# Patient Record
Sex: Male | Born: 1950 | Hispanic: No | Marital: Single | Smoking: Former smoker
Health system: Southern US, Community
[De-identification: ages and names within clinical notes are randomized; demographics above are authoritative.]

## PROBLEM LIST (undated history)

## (undated) DIAGNOSIS — K219 Gastro-esophageal reflux disease without esophagitis: Secondary | ICD-10-CM

## (undated) HISTORY — DX: Gastro-esophageal reflux disease without esophagitis: K21.9

## (undated) HISTORY — PX: FINGER SURGERY: SHX640

---

## 2006-12-05 ENCOUNTER — Ambulatory Visit: Payer: Self-pay | Admitting: Internal Medicine

## 2006-12-13 ENCOUNTER — Ambulatory Visit: Payer: Self-pay | Admitting: Internal Medicine

## 2006-12-14 LAB — CONVERTED CEMR LAB
Glucose, Bld: 92 mg/dL (ref 70–99)
LDL Cholesterol: 71 mg/dL (ref 0–99)
VLDL: 17 mg/dL (ref 0–40)

## 2017-10-08 ENCOUNTER — Ambulatory Visit (INDEPENDENT_AMBULATORY_CARE_PROVIDER_SITE_OTHER)
Admission: RE | Admit: 2017-10-08 | Discharge: 2017-10-08 | Disposition: A | Discharge status: Home / Self Care | Payer: Medicare HMO | Source: Ambulatory Visit | Attending: Internal Medicine | Admitting: Internal Medicine

## 2017-10-08 ENCOUNTER — Encounter: Payer: Self-pay | Admitting: Internal Medicine

## 2017-10-08 ENCOUNTER — Ambulatory Visit (INDEPENDENT_AMBULATORY_CARE_PROVIDER_SITE_OTHER): Payer: Medicare HMO | Admitting: Internal Medicine

## 2017-10-08 VITALS — BP 128/70 | HR 87 | Temp 98.1°F | Ht 73.75 in | Wt 198.0 lb

## 2017-10-08 DIAGNOSIS — R079 Chest pain, unspecified: Secondary | ICD-10-CM

## 2017-10-08 DIAGNOSIS — Z23 Encounter for immunization: Secondary | ICD-10-CM | POA: Diagnosis not present

## 2017-10-08 DIAGNOSIS — K219 Gastro-esophageal reflux disease without esophagitis: Secondary | ICD-10-CM | POA: Diagnosis not present

## 2017-10-08 DIAGNOSIS — R0789 Other chest pain: Secondary | ICD-10-CM | POA: Insufficient documentation

## 2017-10-08 LAB — LIPID PANEL
CHOLESTEROL: 152 mg/dL (ref 0–200)
HDL: 58 mg/dL (ref >39.00)
LDL CALC: 79 mg/dL (ref 0–99)
NonHDL: 94.1
TRIGLYCERIDES: 76 mg/dL (ref 0.0–149.0)
Total CHOL/HDL Ratio: 3
VLDL: 15.2 mg/dL (ref 0.0–40.0)

## 2017-10-08 LAB — COMPREHENSIVE METABOLIC PANEL
ALT: 36 U/L (ref 0–53)
AST: 39 U/L — ABNORMAL HIGH (ref 0–37)
Albumin: 4.6 g/dL (ref 3.5–5.2)
Alkaline Phosphatase: 44 U/L (ref 39–117)
BUN: 22 mg/dL (ref 6–23)
CHLORIDE: 101 meq/L (ref 96–112)
CO2: 32 meq/L (ref 19–32)
Calcium: 10.1 mg/dL (ref 8.4–10.5)
Creatinine, Ser: 1.15 mg/dL (ref 0.40–1.50)
GFR: 67.8 mL/min (ref >60.00)
Glucose, Bld: 102 mg/dL — ABNORMAL HIGH (ref 70–99)
POTASSIUM: 4.2 meq/L (ref 3.5–5.1)
Sodium: 140 mEq/L (ref 135–145)
TOTAL PROTEIN: 7.6 g/dL (ref 6.0–8.3)
Total Bilirubin: 0.9 mg/dL (ref 0.2–1.2)

## 2017-10-08 LAB — CBC
HEMATOCRIT: 46.1 % (ref 39.0–52.0)
HEMOGLOBIN: 15.3 g/dL (ref 13.0–17.0)
MCHC: 33.1 g/dL (ref 30.0–36.0)
MCV: 98 fl (ref 78.0–100.0)
Platelets: 159 10*3/uL (ref 150.0–400.0)
RBC: 4.7 Mil/uL (ref 4.22–5.81)
RDW: 12.3 % (ref 11.5–15.5)
WBC: 6.6 10*3/uL (ref 4.0–10.5)

## 2017-10-08 LAB — T4, FREE: FREE T4: 0.72 ng/dL (ref 0.60–1.60)

## 2017-10-08 MED ORDER — OMEPRAZOLE 20 MG PO CPDR: 20.0000 mg | DELAYED_RELEASE_CAPSULE | Freq: Every day | ORAL | 3 refills | Status: DC

## 2017-10-08 NOTE — Assessment & Plan Note (Addendum)
Worrisome as related to fatigue and not feeling right But it is fairly constant over a week Doesn't seem to be musculoskeletal Could really be GERD related   EKG-- sinus at 87, normal axis and intervals Appears to be normal. No comparison CXR--no pneumonia or other findings. Increased interstitial markings. Will await overread  This is most likely acid related Could be heart though Will set up stress test Trial PPI to see if that helps

## 2017-10-08 NOTE — Progress Notes (Signed)
Subjective:    Patient ID: Travis RainbowJohn Q Bangerter Jr., male    DOB: 06/07/1951, 66 y.o.   MRN: [id number]  HPI Here due to chest pain and to reestablish care Hasn't been seen in 10 years but no other care  Has had a nagging pain across left chest--- may feel something with rubbing it Low energy and "felt crappy" Has been emotional also--very unusual Pain was worse earlier in the week--but other vague symptoms worsened  Did feel it into his left arm No SOB Pain was fairly constant--not clearly exertional Very active at work--lifting/walking, etc----- did note he was slower yesterday  No palpitations No dizziness  He called me yesterday ---I set up appt and advised 911 for recurrence Took ASA also then Still just has a mild tightness in upper left chest  Has been drinking more lately--relates to stress Mostly scotches-- at least 2 in evening to settle down  No current outpatient medications on file prior to visit.   No current facility-administered medications on file prior to visit.     No Known Allergies  Past Medical History:  Diagnosis Date  . GERD (gastroesophageal reflux disease)     Past Surgical History:  Procedure Laterality Date  . FINGER SURGERY     left 5th finger ---twice as child. chronic contracture    Family History  Problem Relation Age of Onset  . Breast cancer Mother   . Heart disease Father   . Breast cancer Sister   . Diabetes Neg Hx     Social History   Socioeconomic History  . Marital status: Divorced    Spouse name: Not on file  . Number of children: 2  . Years of education: Not on file  . Highest education level: Not on file  Social Needs  . Financial resource strain: Not on file  . Food insecurity - worry: Not on file  . Food insecurity - inability: Not on file  . Transportation needs - medical: Not on file  . Transportation needs - non-medical: Not on file  Occupational History  . Occupation: Holiday representativeConstruction    Comment: own  business  . Occupation: Interior and spatial designeracristan    Comment: [employer]  Tobacco Use  . Smoking status: Former Smoker    Last attempt to quit: 1978    Years since quitting: 40.9  . Smokeless tobacco: Never Used  Substance and Sexual Activity  . Alcohol use: Yes  . Drug use: Not on file  . Sexual activity: Not on file  Other Topics Concern  . Not on file  Social History Narrative   1 son and 1 daughter   Review of Systems  Constitutional: Negative for appetite change.       Weight has drifted up  HENT: Positive for hearing loss. Negative for tinnitus and trouble swallowing.   Eyes:       New bifocals--no vision loss  Respiratory: Positive for chest tightness. Negative for cough and shortness of breath.   Cardiovascular: Positive for chest pain. Negative for palpitations and leg swelling.  Gastrointestinal: Negative for blood in stool and constipation.       Rare heartburn/acid now Will have some water brash--better if he drinks water nexium in the past but none in about a year  Endocrine: Positive for polydipsia.  Genitourinary: Negative for difficulty urinating and urgency.  Musculoskeletal: Negative for arthralgias, back pain and joint swelling.  Skin: Negative for rash.       Needs derm evaluation--minor brown spots Some  trouble with psoriasis lately--uses topicals (on elbows, legs and scalp)  Allergic/Immunologic: Positive for environmental allergies. Negative for immunocompromised state.  Neurological: Negative for dizziness, syncope and headaches.  Hematological: Negative for adenopathy. Does not bruise/bleed easily.  Psychiatric/Behavioral: Negative for dysphoric mood and sleep disturbance.       Objective:   Physical Exam  Constitutional: He appears well-developed. No distress.  HENT:  Mouth/Throat: Oropharynx is clear and moist. No oropharyngeal exudate.  Neck: Normal range of motion. No thyromegaly present.  Cardiovascular: Normal rate, regular rhythm, normal  heart sounds and intact distal pulses. Exam reveals no gallop and no friction rub.  No murmur heard. Pulmonary/Chest: Effort normal and breath sounds normal. No respiratory distress. He has no wheezes. He has no rales. He exhibits no tenderness.  Abdominal: Soft. He exhibits no distension. There is no rebound and no guarding.  Chronic sensitivity in epigastric area--no change (but hard to assess due to this)  Musculoskeletal: He exhibits no edema or tenderness.  Lymphadenopathy:    He has no cervical adenopathy.  Skin:  Mild psoriatic changes  Psychiatric: He has a normal mood and affect. His behavior is normal.          Assessment & Plan:

## 2017-10-08 NOTE — Addendum Note (Signed)
Addended by: Eual FinesBRIDGES, SHANNON P on: 10/08/2017 04:56 PM   Modules accepted: Orders

## 2017-10-08 NOTE — Assessment & Plan Note (Signed)
I suspect his symptoms are acid related---like esophagitis Rx sent--asked him to take for at least 2 weeks

## 2017-10-09 ENCOUNTER — Other Ambulatory Visit: Payer: Self-pay | Admitting: Internal Medicine

## 2017-10-09 ENCOUNTER — Encounter: Payer: Self-pay | Admitting: Internal Medicine

## 2017-10-09 DIAGNOSIS — Z1211 Encounter for screening for malignant neoplasm of colon: Secondary | ICD-10-CM

## 2017-10-10 NOTE — Telephone Encounter (Signed)
This encounter was created in error - please disregard.

## 2017-10-15 ENCOUNTER — Telehealth: Payer: Self-pay

## 2017-10-15 NOTE — Telephone Encounter (Signed)
Pt scheduled for 12/4, 9am GXT. Reviewed instructions and directions to our office w/pt who verbalized understanding.

## 2017-10-16 ENCOUNTER — Ambulatory Visit (INDEPENDENT_AMBULATORY_CARE_PROVIDER_SITE_OTHER): Payer: Medicare HMO

## 2017-10-16 DIAGNOSIS — R079 Chest pain, unspecified: Secondary | ICD-10-CM

## 2017-10-19 LAB — EXERCISE TOLERANCE TEST
CHL CUP RESTING HR STRESS: 84 {beats}/min
CSEPEDS: 21 s
CSEPHR: 105 %
Estimated workload: 9 METS
Exercise duration (min): 7 min
MPHR: 155 {beats}/min
Peak HR: 164 {beats}/min

## 2018-02-05 DIAGNOSIS — Z87891 Personal history of nicotine dependence: Secondary | ICD-10-CM | POA: Diagnosis not present

## 2018-02-05 DIAGNOSIS — Z8249 Family history of ischemic heart disease and other diseases of the circulatory system: Secondary | ICD-10-CM | POA: Diagnosis not present

## 2018-02-05 DIAGNOSIS — Z803 Family history of malignant neoplasm of breast: Secondary | ICD-10-CM | POA: Diagnosis not present

## 2018-02-05 DIAGNOSIS — R030 Elevated blood-pressure reading, without diagnosis of hypertension: Secondary | ICD-10-CM | POA: Diagnosis not present

## 2018-02-28 ENCOUNTER — Telehealth: Payer: Self-pay | Admitting: Internal Medicine

## 2018-02-28 MED ORDER — AMOXICILLIN 500 MG PO TABS: 1000.0000 mg | ORAL_TABLET | Freq: Two times a day (BID) | ORAL | 0 refills | Status: AC

## 2018-02-28 NOTE — Telephone Encounter (Signed)
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

## 2018-10-14 ENCOUNTER — Encounter: Payer: Medicare HMO | Admitting: Internal Medicine

## 2018-10-14 DIAGNOSIS — Z0289 Encounter for other administrative examinations: Secondary | ICD-10-CM

## 2019-07-03 ENCOUNTER — Other Ambulatory Visit: Payer: Self-pay

## 2019-07-03 DIAGNOSIS — Z20822 Contact with and (suspected) exposure to covid-19: Secondary | ICD-10-CM

## 2019-07-04 LAB — NOVEL CORONAVIRUS, NAA: SARS-CoV-2, NAA: NOT DETECTED

## 2019-12-01 ENCOUNTER — Telehealth: Payer: Self-pay | Admitting: *Deleted

## 2019-12-01 ENCOUNTER — Encounter: Payer: Self-pay | Admitting: Internal Medicine

## 2019-12-01 ENCOUNTER — Ambulatory Visit (INDEPENDENT_AMBULATORY_CARE_PROVIDER_SITE_OTHER): Payer: HRSA Program | Admitting: Internal Medicine

## 2019-12-01 DIAGNOSIS — U071 COVID-19: Secondary | ICD-10-CM | POA: Diagnosis not present

## 2019-12-01 NOTE — Assessment & Plan Note (Addendum)
Exposed earlier but first symptoms on 1/13 Health department has extended his quarantine to 1/23--and I told him it needs to be when he has been feeling better for a few days Nothing to suggest pneumonia for now Discussed starting regular tylenol Fluids, calorie rich foot (having peanut butter crackers) Needs reevaluation if worsens

## 2019-12-01 NOTE — Telephone Encounter (Signed)
[facility] Night - Client TELEPHONE ADVICE RECORD Access[facility]urse Patient Name: Travis Pacheco Gender: Male DOB: 08/19/1950 Age: 69 Y 3 M 12 D Return Phone Number: [phone] (Primary) Address: City/State/Zip: [place] [address] Client [facility] Night - Client Client Site [facility] - Night Physician Tillman Abide - MD Contact Type Call Who Is Calling Patient / Member / Family / Caregiver Call Type Triage / Clinical Caller Name Caspian Deleonardis Ray Relationship To Patient Daughter Return Phone Number [phone] (Primary) Chief Complaint Back Pain - General Reason for Call Symptomatic / Request for Health Information Initial Comment Caller states she is calling regarding her father who currently has Covid and is in extreme pain, she states everywhere but mostly his back. Caller is trying to see if the Dr. can prescribe him something so he isn't round other people. Additional Comment Caller was not positive of her dad's year of birth. Translation No No Triage Reason Client directive prohibits Nurse Assessment Nurse: Gasper Sells, RN, Marylu Lund Date/Time Lamount Cohen Time): 12/01/2019 8:23:23 AM Confirm and document reason for call. If symptomatic, describe symptoms. ---Caller states she is calling regarding her father who currently has Covid and is in extreme pain, she states everywhere but mostly his back. Caller is trying to see if the Dr. can prescribe him something so he isn't round other people. Daughter is not with pt. States he is home and doubled over in pain/with girlfriend. Has the patient had close contact with a person known or suspected to have the novel coronavirus illness OR traveled / lives in area with major community spread (including international travel) in the last 14 days from the onset of symptoms? * If Asymptomatic, screen for exposure and travel within the last 14 days. ---Yes Does the  patient have any new or worsening symptoms? ---Yes Will a triage be completed? ---No Select reason for no triage. ---Client directive prohibits Please document clinical information provided and list any resource used. ---Unable to triage as pt is not with caller. Advised to connect 3 way with pt for permission and to ask pt questions. Advised office is closed. Pt has to be with the caller for triage. States he can't talk. Advised if pt is unable to speak, doubled over then he should go to ER for assistance. Caller hung up on nurse. PLEASE NOTE: All timestamps contained within this report are represented as [place] Standard Time. CONFIDENTIALTY NOTICE: This fax transmission is intended only for the addressee. It contains information that is legally privileged, confidential or otherwise protected from use or disclosure. If you are not the intended recipient, you are strictly prohibited from reviewing, disclosing, copying using or disseminating any of this information or taking any action in reliance on or regarding this information. If you have received this fax in error, please notify us immediately by telephone so that we can arrange for its return to [place]. Phone: [phone], Toll-Free: [phone], Fax: [phone] Page: 2 of 2 Call Id: [id number] Guidelines Guideline Title Affirmed Question Affirmed Notes Nurse Date/Time Lamount Cohen Time) Disp. Time Lamount Cohen Time) Disposition Final User 12/01/2019 8:03:37 AM Attempt made - message left Quentin Cornwall 12/01/2019 8:27:08 AM Clinical Call Yes Gasper Sells, RN, Marylu Lund

## 2019-12-01 NOTE — Telephone Encounter (Signed)
Patient called stating that he tested positive for covid on 11/25/19. Patient stated that he has had a fever. Patient stated that his temperature usually runs 3-4 degrees lower than normal and his temp has been running around 99.0. Patient stated that he has had sweats, chills, dry cough, vomited a little, body aches, fatigue and SOB. Patient stated that he has been able to drink and keep fluids down this morning. Advised patient to try to push fluids and rest. Patient was scheduled today for a virtual visit with Dr. Alphonsus Sias today at 2:45. ER precautions were given to patient and he verbalized understanding.

## 2019-12-01 NOTE — Telephone Encounter (Signed)
Pt already has virtual visit schedule with Dr Alphonsus Sias today at 2:45. [facility] LPN said when she talked with pt pt was not in extreme pain. FYI to Dr Darra Lis.

## 2019-12-01 NOTE — Progress Notes (Signed)
Subjective:    Patient ID: Travis Pacheco., male    DOB: [birth year], 69 y.o.   MRN: [id number]  HPI Video virtual visit due to positive COVID test and symptoms Identification done Reviewed billing and he gave consent Participants--he is at home and I am in my office  Exposed to COVID on New Year's Eve---someone with known exposure but not positive test Was told by health department to quarantine 1/5 Thinks he was exposed through his neighbor  Tested on 1/13--results positive the next day. This is the day of his first symptoms  Biggest issue is fatigue--"serious" Trying to force himself to drink liquids--but knows it isn't enough Various aches and pains No clear fever--but he is higher than his usual  No clear SOB Mild dry cough  Current Outpatient Medications on File Prior to Visit  Medication Sig Dispense Refill  . omeprazole (PRILOSEC) 20 MG capsule Take 1 capsule (20 mg total) by mouth daily. 30 capsule 3   No current facility-administered medications on file prior to visit.    No Known Allergies  Past Medical History:  Diagnosis Date  . GERD (gastroesophageal reflux disease)     Past Surgical History:  Procedure Laterality Date  . FINGER SURGERY     left 5th finger ---twice as child. chronic contracture    Family History  Problem Relation Age of Onset  . Breast cancer Mother   . Heart disease Father   . Breast cancer Sister   . Diabetes Neg Hx     Social History   Socioeconomic History  . Marital status: Divorced    Spouse name: Not on file  . Number of children: 2  . Years of education: Not on file  . Highest education level: Not on file  Occupational History  . Occupation: Holiday representative    Comment: own business  . 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 Smoking status: Former Smoker    Quit date: 1978    Years since quitting: 43.0  . Smokeless tobacco: Never Used  Substance and Sexual Activity  .  Alcohol use: Yes  . Drug use: Not on file  . Sexual activity: Not on file  Other Topics Concern  . Not on file  Social History Narrative   1 son and 1 daughter   Social Determinants of Health   Financial Resource Strain:   . Difficulty of Paying Living Expenses: Not on file  Food Insecurity:   . Worried About Programme researcher, broadcasting/film/video in the Last Year: Not on file  . Ran Out of Food in the Last Year: Not on file  Transportation Needs:   . Lack of Transportation (Medical): Not on file  . Lack of Transportation (Non-Medical): Not on file  Physical Activity:   . Days of Exercise per Week: Not on file  . Minutes of Exercise per Session: Not on file  Stress:   . Feeling of Stress : Not on file  Social Connections:   . Frequency of Communication with Friends and Family: Not on file  . Frequency of Social Gatherings with Friends and Family: Not on file  . Attends Religious Services: Not on file  . Active Member of Clubs or Organizations: Not on file  . Attends Banker Meetings: Not on file  . Marital Status: Not on file  Intimate Partner Violence:   . Fear of Current or Ex-Partner: Not on file  . Emotionally Abused: Not on file  .  Physically Abused: Not on file  . Sexually Abused: Not on file    Review of Systems Never can smell. Taste is off Some occasional nausea--only brief Slightly loose stools since yesterday--gas and some slight watery discharge No abdominal pain--but no appetite Has had sniffles since December--so staying away from work    Objective:   Physical Exam  Constitutional: No distress.  Looks washed out but strong voice and alert  Respiratory: Effort normal. No respiratory distress.  Psychiatric: He has a normal mood and affect. His behavior is normal.           Assessment & Plan:

## 2019-12-01 NOTE — Telephone Encounter (Signed)
Okay Will evaluate at virtual visit and see if any supportive measures might help (like anti-emetics)

## 2019-12-01 NOTE — Telephone Encounter (Signed)
Noted  Will review all at visit and consider analgesic alternatives

## 2019-12-03 ENCOUNTER — Telehealth: Payer: Self-pay | Admitting: Internal Medicine

## 2019-12-03 MED ORDER — ONDANSETRON HCL 4 MG PO TABS: 4.0000 mg | ORAL_TABLET | Freq: Three times a day (TID) | ORAL | 0 refills | Status: DC | PRN

## 2019-12-03 NOTE — Telephone Encounter (Signed)
Contacted me No respiratory symptoms but is having diarrhea and nausea Will send Rx for ondansetron for the nausea---in hopes this will allow him to maintain oral intake and prevent dehydration

## 2020-01-05 ENCOUNTER — Ambulatory Visit: Payer: Self-pay | Attending: Internal Medicine

## 2020-01-05 DIAGNOSIS — Z23 Encounter for immunization: Secondary | ICD-10-CM | POA: Insufficient documentation

## 2020-01-05 NOTE — Progress Notes (Signed)
   [id number]  Name:  Travis Pacheco.    MRN: [id number] DOB: [birth year]  01/05/2020  Travis Pacheco was observed post Covid-19 immunization for 15 minutes without incidence. He was provided with Vaccine Information Sheet and instruction to access the V-Safe system.   Travis Pacheco was instructed to call 911 with any severe reactions post vaccine: Marland Kitchen Difficulty breathing  . Swelling of your face and throat  . A fast heartbeat  . A bad rash all over your body  . Dizziness and weakness    Immunizations Administered    Name Date Dose VIS Date Route   Moderna COVID-19 Vaccine 01/05/2020 11:09 AM 0.5 mL 10/14/2019 Intramuscular   Manufacturer: Moderna   Lot: [id number]   NDC: 44461-901-22

## 2020-02-03 ENCOUNTER — Ambulatory Visit: Payer: Self-pay | Attending: Internal Medicine

## 2020-02-03 DIAGNOSIS — Z23 Encounter for immunization: Secondary | ICD-10-CM

## 2020-02-03 NOTE — Progress Notes (Signed)
   [id number]  Name:  Travis Pacheco.    MRN: [id number] DOB: Jun 28, 1951  02/03/2020  [name] was observed post Covid-19 immunization for 15 minutes without incident. He was provided with Vaccine Information Sheet and instruction to access the V-Safe system.   [name] was instructed to call 911 with any severe reactions post vaccine: Marland Kitchen Difficulty breathing  . Swelling of face and throat  . A fast heartbeat  . A bad rash all over body  . Dizziness and weakness   Immunizations Administered    Name Date Dose VIS Date Route   Moderna COVID-19 Vaccine 02/03/2020  9:58 AM 0.5 mL 10/14/2019 Intramuscular   Manufacturer: Gala Murdoch   Lot: [id number]   NDC: 71994-129-04

## 2020-09-06 ENCOUNTER — Ambulatory Visit: Payer: Self-pay | Admitting: Internal Medicine

## 2020-09-06 ENCOUNTER — Encounter: Payer: Self-pay | Admitting: Internal Medicine

## 2020-09-06 ENCOUNTER — Telehealth: Payer: Self-pay | Admitting: Internal Medicine

## 2020-09-06 ENCOUNTER — Other Ambulatory Visit: Payer: Self-pay

## 2020-09-06 VITALS — BP 110/70 | HR 116 | Temp 97.8°F | Ht 76.0 in | Wt 197.0 lb

## 2020-09-06 DIAGNOSIS — Z23 Encounter for immunization: Secondary | ICD-10-CM

## 2020-09-06 DIAGNOSIS — R319 Hematuria, unspecified: Secondary | ICD-10-CM

## 2020-09-06 DIAGNOSIS — N390 Urinary tract infection, site not specified: Secondary | ICD-10-CM | POA: Insufficient documentation

## 2020-09-06 DIAGNOSIS — R300 Dysuria: Secondary | ICD-10-CM

## 2020-09-06 LAB — POC URINALSYSI DIPSTICK (AUTOMATED)
Bilirubin, UA: NEGATIVE
Glucose, UA: NEGATIVE
Nitrite, UA: NEGATIVE
Protein, UA: POSITIVE — AB
Spec Grav, UA: 1.015 (ref 1.010–1.025)
Urobilinogen, UA: 1 E.U./dL
pH, UA: 6.5 (ref 5.0–8.0)

## 2020-09-06 MED ORDER — SULFAMETHOXAZOLE-TRIMETHOPRIM 800-160 MG PO TABS: 1.0000 | ORAL_TABLET | Freq: Two times a day (BID) | ORAL | 0 refills | Status: DC

## 2020-09-06 MED ORDER — CEFTRIAXONE SODIUM 1 G IJ SOLR
1.0000 g | Freq: Once | INTRAMUSCULAR | Status: AC
  Administered 2020-09-06: 1 g via INTRAMUSCULAR

## 2020-09-06 NOTE — Assessment & Plan Note (Signed)
Symptoms suggestive of kidney involvement---systemic symptoms, CVA tenderness No history of stones but that could explain it (especially if obstruction) No clear indication of urinary obstruction--but that is possible Will Rx with rocephin now---then septra Check CT scan Urology evaluation

## 2020-09-06 NOTE — Addendum Note (Signed)
Addended by: Eual Fines on: 09/06/2020 04:15 PM   Modules accepted: Orders

## 2020-09-06 NOTE — Progress Notes (Signed)
Subjective:    Patient ID: Roseanna Rainbow., male    DOB: 12-16-1950, 69 y.o.   MRN: [id number]  HPI Here due to urinary symptoms This visit occurred during the SARS-CoV-2 public health emergency.  Safety protocols were in place, including screening questions prior to the visit, additional usage of staff PPE, and extensive cleaning of exam room while observing appropriate contact time as indicated for disinfecting solutions.   Started 2 days ago--but very mild Mild dysuria that night Yesterday AM---noted significant urgency and dysuria Then started having flecks of blood  Has felt somewhat bad No fever No sweats---?slight chill  Current Outpatient Medications on File Prior to Visit  Medication Sig Dispense Refill   omeprazole (PRILOSEC) 20 MG capsule Take 1 capsule (20 mg total) by mouth daily. 30 capsule 3   No current facility-administered medications on file prior to visit.    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  Tobacco Use   Smoking status: Former Smoker    Quit date: 1978    Years since quitting: 43.8   Smokeless tobacco: Never Used  Substance and Sexual Activity   Alcohol use: Yes   Drug use: Not on file   Sexual activity: Not on file  Other Topics Concern   Not on file   Social History Narrative   1 son and 1 daughter   Social Determinants of Health   Financial Resource Strain:    Difficulty of Paying Living Expenses: Not on file  Food Insecurity:    Worried About Programme researcher, broadcasting/film/video in the Last Year: Not on file   [employer] of Food in the Last Year: Not on file  Transportation Needs:    Lack of Transportation (Medical): Not on file   Lack of Transportation (Non-Medical): Not on file  Physical Activity:    Days of Exercise per Week: Not on file   Minutes of Exercise per Session: Not on file  Stress:    Feeling of Stress : Not on file  Social Connections:    Frequency of Communication with Friends and Family: Not on file   Frequency of Social Gatherings with Friends and Family: Not on file   Attends Religious Services: Not on file   Active Member of Clubs or Organizations: Not on file   Attends Banker Meetings: Not on file   Marital Status: Not on file  Intimate Partner Violence:    Fear of Current or Ex-Partner: Not on file   Emotionally Abused: Not on file   Physically Abused: Not on file   Sexually Abused: Not on file   Review of Systems Didn't sleep well last night Some vomiting---dry heaves (this  morning) Hasn't eaten today No history of kidney stones Has had 2 episodes of prostatitis----symptoms were not like this    Objective:   Physical Exam Constitutional:      Appearance: Normal appearance.  Cardiovascular:     Rate and Rhythm: Normal rate and regular rhythm.     Heart sounds: No murmur heard.  No gallop.   Pulmonary:     Effort: Pulmonary effort is normal.     Breath sounds: Normal breath sounds. No wheezing or rales.  Abdominal:     Palpations: Abdomen is soft.     Tenderness: There is no abdominal tenderness.     Comments: Mild left CVA tenderness  Musculoskeletal:     Cervical back: Neck supple.     Right lower leg: No edema.     Left lower leg: No edema.  Lymphadenopathy:      Cervical: No cervical adenopathy.  Neurological:     Mental Status: He is alert.            Assessment & Plan:

## 2020-09-06 NOTE — Telephone Encounter (Signed)
Called to schedule appt today at 1:45 per request from Dr. Alphonsus Sias. Phone went straight to voicemail. I left vm asking him to call office to schedule.

## 2020-09-06 NOTE — Addendum Note (Signed)
Addended by: Eual Fines on: 09/06/2020 04:20 PM   Modules accepted: Orders

## 2020-09-06 NOTE — Addendum Note (Signed)
Addended by: Eual Fines on: 09/06/2020 03:03 PM   Modules accepted: Orders

## 2020-09-07 LAB — CBC
HCT: 46.3 % (ref 39.0–52.0)
Hemoglobin: 15.9 g/dL (ref 13.0–17.0)
MCHC: 34.3 g/dL (ref 30.0–36.0)
MCV: 99.3 fl (ref 78.0–100.0)
Platelets: 138 10*3/uL — ABNORMAL LOW (ref 150.0–400.0)
RBC: 4.66 Mil/uL (ref 4.22–5.81)
RDW: 12.1 % (ref 11.5–15.5)
WBC: 12.5 10*3/uL — ABNORMAL HIGH (ref 4.0–10.5)

## 2020-09-07 LAB — RENAL FUNCTION PANEL
Albumin: 4.4 g/dL (ref 3.5–5.2)
BUN: 14 mg/dL (ref 6–23)
CO2: 29 mEq/L (ref 19–32)
Calcium: 9.8 mg/dL (ref 8.4–10.5)
Chloride: 98 mEq/L (ref 96–112)
Creatinine, Ser: 1.08 mg/dL (ref 0.40–1.50)
GFR: 70.74 mL/min (ref >60.00)
Glucose, Bld: 111 mg/dL — ABNORMAL HIGH (ref 70–99)
Phosphorus: 2.5 mg/dL (ref 2.3–4.6)
Potassium: 4.1 mEq/L (ref 3.5–5.1)
Sodium: 136 mEq/L (ref 135–145)

## 2020-09-07 LAB — HEPATIC FUNCTION PANEL
ALT: 16 U/L (ref 0–53)
AST: 22 U/L (ref 0–37)
Albumin: 4.4 g/dL (ref 3.5–5.2)
Alkaline Phosphatase: 53 U/L (ref 39–117)
Bilirubin, Direct: 0.4 mg/dL — ABNORMAL HIGH (ref 0.0–0.3)
Total Bilirubin: 2.2 mg/dL — ABNORMAL HIGH (ref 0.2–1.2)
Total Protein: 7.4 g/dL (ref 6.0–8.3)

## 2020-09-08 LAB — URINE CULTURE
MICRO NUMBER:: 11113791
SPECIMEN QUALITY:: ADEQUATE

## 2020-09-14 ENCOUNTER — Other Ambulatory Visit: Payer: Self-pay

## 2020-09-14 ENCOUNTER — Ambulatory Visit (INDEPENDENT_AMBULATORY_CARE_PROVIDER_SITE_OTHER): Payer: Self-pay | Admitting: Urology

## 2020-09-14 ENCOUNTER — Encounter: Payer: Self-pay | Admitting: Urology

## 2020-09-14 VITALS — BP 123/71 | HR 79 | Ht 76.0 in | Wt 192.0 lb

## 2020-09-14 DIAGNOSIS — R310 Gross hematuria: Secondary | ICD-10-CM

## 2020-09-14 DIAGNOSIS — N419 Inflammatory disease of prostate, unspecified: Secondary | ICD-10-CM

## 2020-09-14 MED ORDER — SULFAMETHOXAZOLE-TRIMETHOPRIM 800-160 MG PO TABS: 1.0000 | ORAL_TABLET | Freq: Two times a day (BID) | ORAL | 0 refills | Status: DC

## 2020-09-14 NOTE — Progress Notes (Signed)
   09/14/2020 2:58 PM   Travis Pacheco. 08/02/1951 [id number]  Referring provider: [name], MD [address] [place],  [place] [address]  Chief Complaint  Patient presents with  . Hematuria    HPI: Travis Pacheco. is a 69 y.o. male seen at the request of Dr. Alphonsus Sias for evaluation of UTI/gross hematuria.   Seen 09/06/2020 by Dr. Alphonsus Sias with a 2-day history of mild dysuria which progressively worsened and associated with mild gross hematuria  Associated malaise   Dipstick urinalysis with large leukocytes  Was treated with 1 g Rocephin and discharged with Rx Septra DS  Urine subsequently grew E. coli sensitive to ceftriaxone and Septra  States symptoms resolved within 8 hours of the injection  Presently asymptomatic  Denies recurrent hematuria  Prior history of prostatitis  Scheduled for CT abdomen pelvis with contrast 09/21/2020   PMH: Past Medical History:  Diagnosis Date  . GERD (gastroesophageal reflux disease)     Surgical History: Past Surgical History:  Procedure Laterality Date  . FINGER SURGERY     left 5th finger ---twice as child. chronic contracture    Home Medications:  Allergies as of 09/14/2020   No Known Allergies     Medication List       Accurate as of September 14, 2020  2:58 PM. If you have any questions, ask your nurse or doctor.        omeprazole 20 MG capsule Commonly known as: PRILOSEC Take 1 capsule (20 mg total) by mouth daily.   sulfamethoxazole-trimethoprim 800-160 MG tablet Commonly known as: BACTRIM DS Take 1 tablet by mouth 2 (two) times daily.       Allergies: No Known Allergies  Family History: Family History  Problem Relation Age of Onset  . Breast cancer Mother   . Heart disease Father   . Breast cancer Sister   . Diabetes Neg Hx     Social History:  reports that he quit smoking about 43 years ago. He has never used smokeless tobacco. He reports current alcohol use. No history on  file for drug use.   Physical Exam: BP 123/71   Pulse 79   Ht 6\' 4"  (1.93 m)   Wt 192 lb (87.1 kg)   BMI 23.37 kg/m   Constitutional:  Alert and oriented, No acute distress. HEENT: [place] AT, moist mucus membranes.  Trachea midline, no masses. Cardiovascular: No clubbing, cyanosis, or edema. Respiratory: Normal respiratory effort, no increased work of breathing. GI: Abdomen is soft, nontender, nondistended, no abdominal masses GU: No CVA tenderness, prostate 35 g, smooth without nodules Skin: No rashes, bruises or suspicious lesions. Neurologic: Grossly intact, no focal deficits, moving all 4 extremities. Psychiatric: Normal mood and affect.  Laboratory Data:  Urinalysis Dipstick/microscopy negative  Assessment & Plan:    1.  Urinary tract infection  We did discuss in men the most common source is the prostate and he had mild systemic symptoms  Would recommend treating with a 3-week course of antibiotics and an additional 10 days of Septra DS was sent to pharmacy  2.  Gross hematuria  Most likely secondary to above  He is scheduled for CT abdomen with contrast and will change to a CT urogram which is the recommended diagnostic imaging for hematuria   , MD  [facility] [address] [place], [place] [phone]

## 2020-09-15 ENCOUNTER — Telehealth: Payer: Self-pay | Admitting: Internal Medicine

## 2020-09-15 NOTE — Telephone Encounter (Signed)
Dr.Stoioff's office called to into PCP that they did see patient and canceled our original order of the CT.In their office it needed to be a CT Hematuria workup but did state for same thing. Sending as Lorain Childes.

## 2020-09-16 LAB — MICROSCOPIC EXAMINATION: Bacteria, UA: NONE SEEN

## 2020-09-16 LAB — URINALYSIS, COMPLETE
Bilirubin, UA: NEGATIVE
Glucose, UA: NEGATIVE
Ketones, UA: NEGATIVE
Leukocytes,UA: NEGATIVE
Nitrite, UA: NEGATIVE
Protein,UA: NEGATIVE
RBC, UA: NEGATIVE
Specific Gravity, UA: 1.025 (ref 1.005–1.030)
Urobilinogen, Ur: 0.2 mg/dL (ref 0.2–1.0)
pH, UA: 5.5 (ref 5.0–7.5)

## 2020-09-16 NOTE — Telephone Encounter (Signed)
Yes---I saw his note and am in agreement with the best test to evaluate the circumstances

## 2020-09-21 ENCOUNTER — Ambulatory Visit
Admission: RE | Admit: 2020-09-21 | Discharge: 2020-09-21 | Disposition: A | Discharge status: Home / Self Care | Payer: Self-pay | Source: Ambulatory Visit | Attending: Urology | Admitting: Urology

## 2020-09-21 ENCOUNTER — Ambulatory Visit: Payer: Self-pay

## 2020-09-21 ENCOUNTER — Other Ambulatory Visit: Payer: Self-pay

## 2020-09-21 DIAGNOSIS — R310 Gross hematuria: Secondary | ICD-10-CM | POA: Insufficient documentation

## 2020-09-21 MED ORDER — IOHEXOL 300 MG/ML  SOLN
125.0000 mL | Freq: Once | INTRAMUSCULAR | Status: AC | PRN
  Administered 2020-09-21: 125 mL via INTRAVENOUS

## 2020-09-24 ENCOUNTER — Telehealth: Payer: Self-pay | Admitting: Family Medicine

## 2020-09-24 NOTE — Telephone Encounter (Signed)
-----   Message from [name], MD sent at 09/24/2020  2:12 PM EST ----- CT urogram showed no significant abnormalities.  Recommend 1 month follow-up for repeat UA and symptom check

## 2020-09-24 NOTE — Telephone Encounter (Signed)
Patient notified and appointment has been made.  

## 2020-10-11 ENCOUNTER — Ambulatory Visit: Payer: Self-pay | Attending: Internal Medicine

## 2020-10-11 DIAGNOSIS — Z23 Encounter for immunization: Secondary | ICD-10-CM

## 2020-10-11 NOTE — Progress Notes (Signed)
   [id number]  Name:  Travis Pacheco.    MRN: [id number] DOB: 1951-10-09  10/11/2020  Mr. [name] was observed post Covid-19 immunization for 15 minutes without incident. He was provided with Vaccine Information Sheet and instruction to access the V-Safe system.   Mr. [name] was instructed to call 911 with any severe reactions post vaccine: Marland Kitchen Difficulty breathing  . Swelling of face and throat  . A fast heartbeat  . A bad rash all over body  . Dizziness and weakness   Immunizations Administered    Name Date Dose VIS Date Route   Pfizer COVID-19 Vaccine 10/11/2020  1:49 PM 0.3 mL 09/01/2020 Intramuscular   Manufacturer: [employer], [employer]   Lot: [id number]   NDC: [address]-7

## 2020-10-28 ENCOUNTER — Ambulatory Visit: Payer: Self-pay | Admitting: Urology

## 2020-10-29 ENCOUNTER — Encounter: Payer: Self-pay | Admitting: Urology

## 2020-10-29 ENCOUNTER — Ambulatory Visit (INDEPENDENT_AMBULATORY_CARE_PROVIDER_SITE_OTHER): Payer: Self-pay | Admitting: Urology

## 2020-10-29 ENCOUNTER — Other Ambulatory Visit: Payer: Self-pay

## 2020-10-29 VITALS — BP 150/85 | HR 80 | Ht 76.0 in | Wt 192.0 lb

## 2020-10-29 DIAGNOSIS — R310 Gross hematuria: Secondary | ICD-10-CM

## 2020-10-29 LAB — URINALYSIS, COMPLETE
Bilirubin, UA: NEGATIVE
Glucose, UA: NEGATIVE
Ketones, UA: NEGATIVE
Leukocytes,UA: NEGATIVE
Nitrite, UA: NEGATIVE
Protein,UA: NEGATIVE
RBC, UA: NEGATIVE
Specific Gravity, UA: 1.025 (ref 1.005–1.030)
Urobilinogen, Ur: 1 mg/dL (ref 0.2–1.0)
pH, UA: 5.5 (ref 5.0–7.5)

## 2020-10-29 NOTE — Progress Notes (Signed)
10/29/2020 8:38 AM   Travis Pacheco. 28-Nov-1950 [id number]  Referring provider: [name], MD [address] [place],  [place] [address]  Chief Complaint  Patient presents with  . Hematuria    HPI: 69 y.o. male presents for 1 month follow-up   Initially seen 09/14/2020 with UTI suspicious for acute prostatitis that was associated with gross hematuria  CT urogram performed 09/21/2020 showed no upper tract abnormalities and mild bladder trabeculation  He has completed antibiotics and symptoms and hematuria resolved  No complaints today   PMH: Past Medical History:  Diagnosis Date  . GERD (gastroesophageal reflux disease)     Surgical History: Past Surgical History:  Procedure Laterality Date  . FINGER SURGERY     left 5th finger ---twice as child. chronic contracture    Home Medications:  Allergies as of 10/29/2020   No Known Allergies     Medication List       Accurate as of October 29, 2020  8:38 AM. If you have any questions, ask your nurse or doctor.        STOP taking these medications   omeprazole 20 MG capsule Commonly known as: PRILOSEC Stopped by: [name], MD   sulfamethoxazole-trimethoprim 800-160 MG tablet Commonly known as: BACTRIM DS Stopped by: [name], MD       Allergies: No Known Allergies  Family History: Family History  Problem Relation Age of Onset  . Breast cancer Mother   . Heart disease Father   . Breast cancer Sister   . Diabetes Neg Hx     Social History:  reports that he quit smoking about 43 years ago. He has never used smokeless tobacco. He reports current alcohol use. No history on file for drug use.   Physical Exam: BP (!) 150/85   Pulse 80   Ht 6\' 4"  (1.93 m)   Wt 192 lb (87.1 kg)   BMI 23.37 kg/m   Constitutional:  Alert and oriented, No acute distress.  Laboratory Data:  Urinalysis Dipstick/microscopy negative   Pertinent Imaging: CT images personally reviewed  and interpreted  CT HEMATURIA WORKUP  Narrative CLINICAL DATA:  Gross hematuria.  EXAM: CT ABDOMEN AND PELVIS WITHOUT AND WITH CONTRAST  TECHNIQUE: Multidetector CT imaging of the abdomen and pelvis was performed following the standard protocol before and following the bolus administration of intravenous contrast.  CONTRAST:  OMNIPAQUE IOHEXOL 300 MG/ML  SOLN  COMPARISON:  None.  FINDINGS: Lower chest: Unremarkable.  Hepatobiliary: No suspicious focal abnormality within the liver parenchyma. There is no evidence for gallstones, gallbladder wall thickening, or pericholecystic fluid. No intrahepatic or extrahepatic biliary dilation.  Pancreas: No focal mass lesion. No dilatation of the main duct. No intraparenchymal cyst. No peripancreatic edema.  Spleen: No splenomegaly. No focal mass lesion.  Adrenals/Urinary Tract: No adrenal nodule or mass.  Precontrast imaging shows no stones in either kidney or ureter no bladder stones.  Imaging after IV contrast administration shows no suspicious enhancing lesion in either kidney.  Delayed post-contrast imaging shows no wall thickening or soft tissue filling defect in either intrarenal collecting system or renal pelvis. Neither ureter is completely opacified, but there is no focal hydroureter, evidence for ureteral wall thickening, or ureteral mass lesion by CT imaging. Subtle bladder wall trabeculation noted with tiny posterolateral bladder diverticuli seen bilaterally. By CT imaging. No focal bladder wall abnormality  Stomach/Bowel: Stomach is unremarkable. No gastric wall thickening. No evidence of outlet obstruction. Duodenum is  normally positioned as is the ligament of Treitz. No small bowel wall thickening. No small bowel dilatation. The terminal ileum is normal. The appendix is not visualized, but there is no edema or inflammation in the region of the cecum. No gross colonic mass. No colonic  wall thickening.  Vascular/Lymphatic: There is abdominal aortic atherosclerosis without aneurysm. There is no gastrohepatic or hepatoduodenal ligament lymphadenopathy. No retroperitoneal or mesenteric lymphadenopathy. No pelvic sidewall lymphadenopathy.  Reproductive: The prostate gland and seminal vesicles are unremarkable.  Other: No intraperitoneal free fluid.  Musculoskeletal: No worrisome lytic or sclerotic osseous abnormality.  IMPRESSION: 1. No acute findings in the abdomen or pelvis. Specifically, no findings to explain the patient's history of hematuria. 2. Subtle bladder wall trabeculation with tiny posterolateral bladder diverticuli seen bilaterally. Component of underlying bladder outlet obstruction not excluded. 3. Aortic Atherosclerosis (ICD10-I70.0).   Electronically Signed By: Kennith Center M.D. On: 09/22/2020 10:31  No results found for this or any previous visit.   Assessment & Plan:    1. Gross hematuria  Resolved and seen in conjunction with symptomatic urinary tract infection  Hematuria resolved after antibiotic treatment  Urinalysis today is clear  Cystoscopy for recurrent gross or microscopic hematuria  Follow-up 6 months for repeat UA  2. Acute prostatitis  Resolved   [name], MD  [facility] [address] [place], [place] [address] [phone]

## 2021-02-14 IMAGING — CT CT ABD-PEL WO/W CM
3 of 12 series · 11 of 46 positions shown, 17 images · IV contrast (omnipaque)
Comparison: None.

CLINICAL DATA: Gross hematuria.

EXAM:
CT ABDOMEN AND PELVIS WITHOUT AND WITH CONTRAST
TECHNIQUE: Multidetector CT imaging of the abdomen and pelvis was performed
following the standard protocol before and following the bolus
administration of intravenous contrast.
CONTRAST:  125mL OMNIPAQUE IOHEXOL 300 MG/ML  SOLN

[Series 2: abd without pre 5.00 · axial · non-contrast · 0.77mm/px · z∈[-1559,-1444]mm · 3 of 92 slices shown]
[im 12/92  soft-tissue]
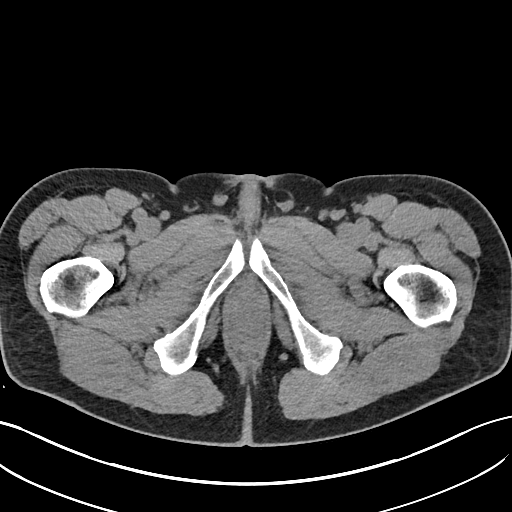
[im 23/92  soft-tissue]
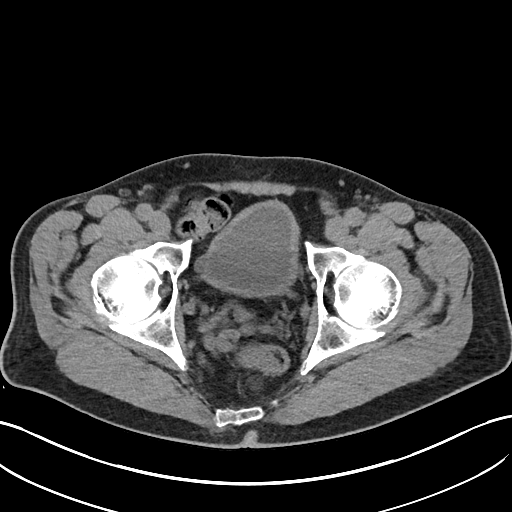
[im 35/92  soft-tissue]
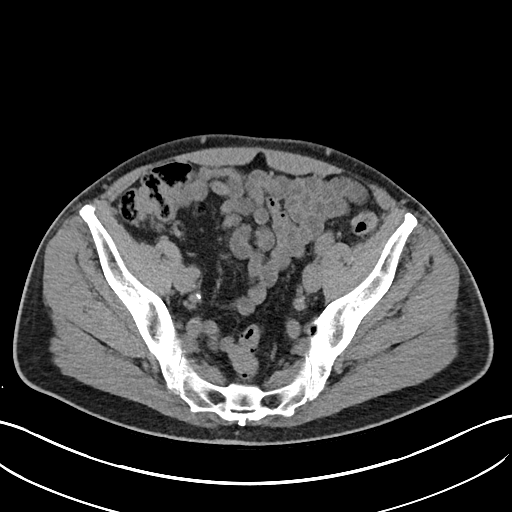

[Series 5: cor without without pre 2.00 cor · coronal · non-contrast · 0.77mm/px · 2 of 147 slices shown, 3 images]
[im 49/147  soft-tissue]
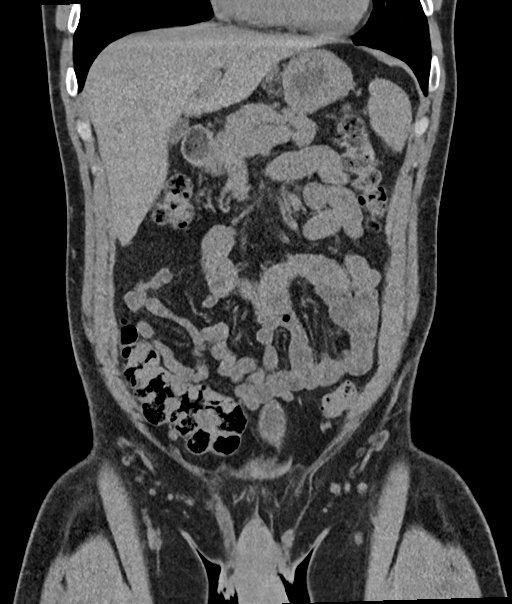
[im 49/147  bone]
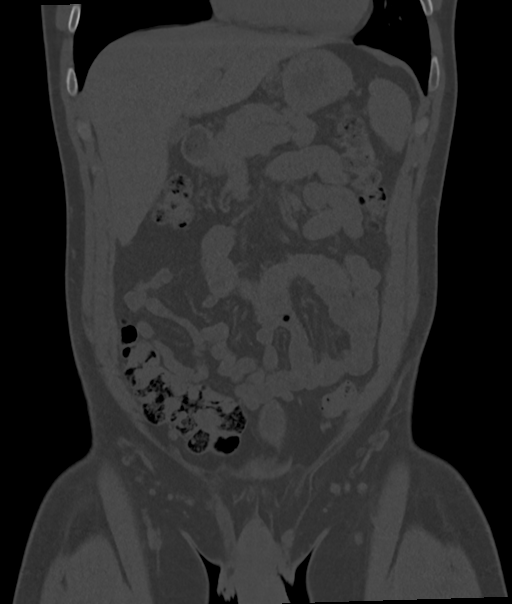
[im 98/147  soft-tissue]
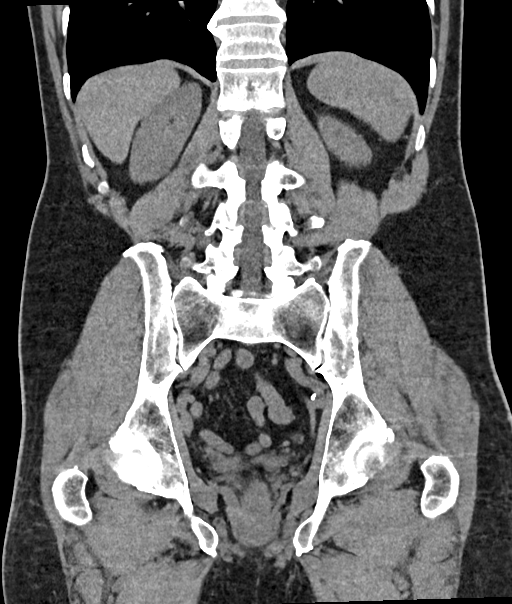

[Series 17: axial delay delay prone 5.00 · axial · delayed · 0.79mm/px · z∈[-1543,-1208]mm · 6 of 95 slices shown, 11 images]
[im 14/95  soft-tissue]
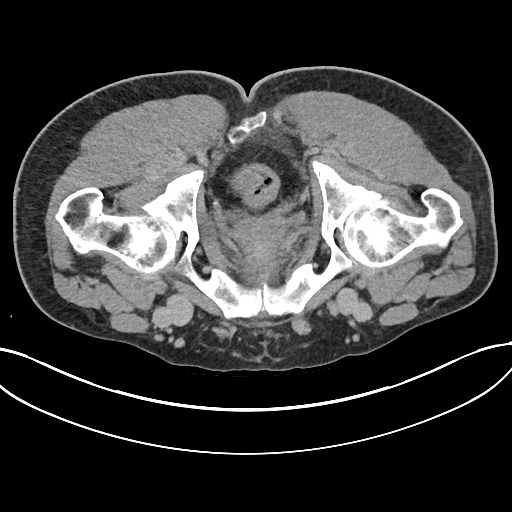
[im 14/95  bone]
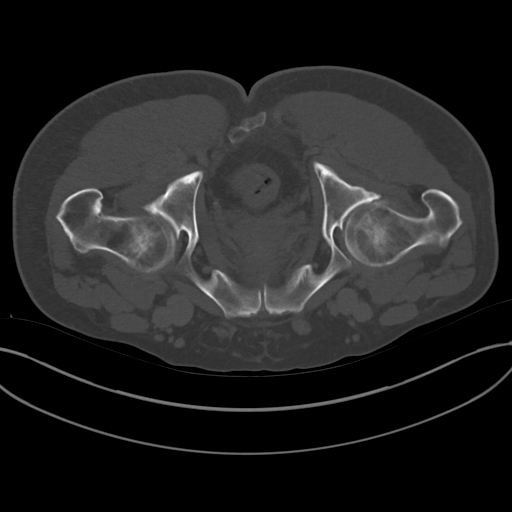
[im 27/95  soft-tissue]
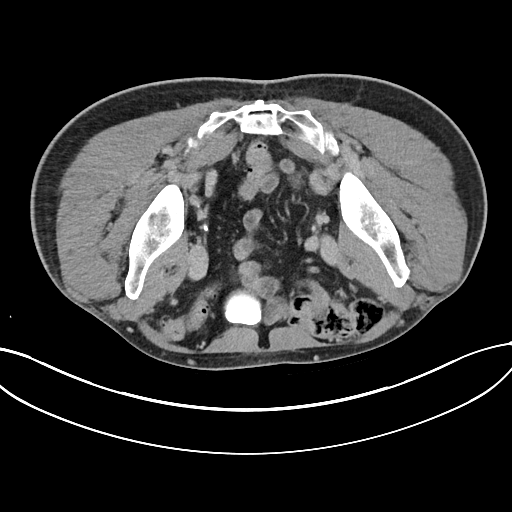
[im 41/95  soft-tissue]
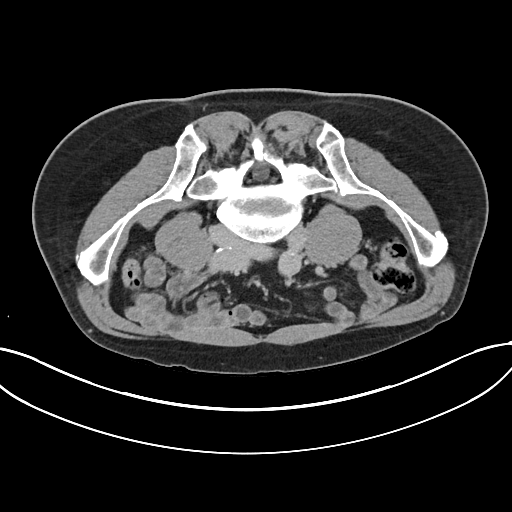
[im 41/95  lung]
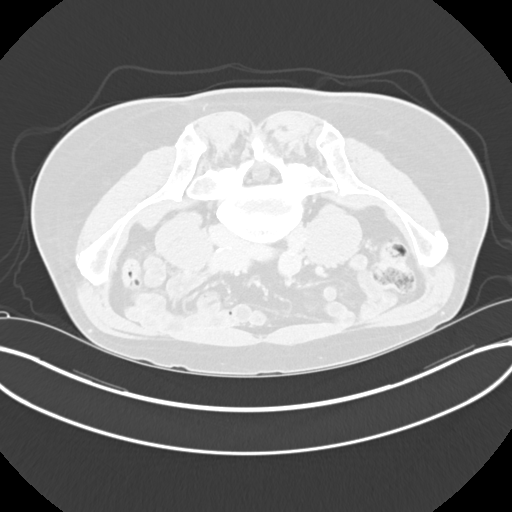
[im 54/95  soft-tissue]
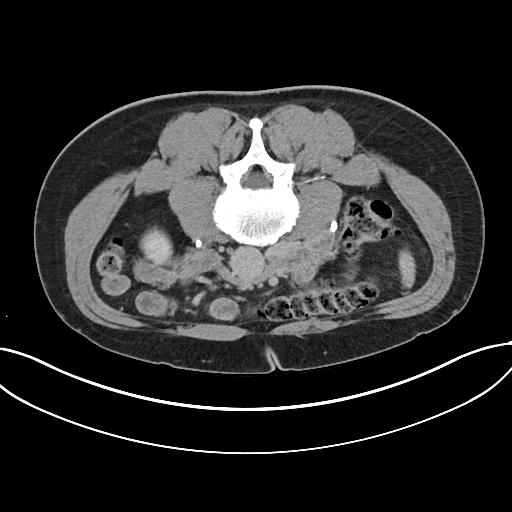
[im 54/95  lung]
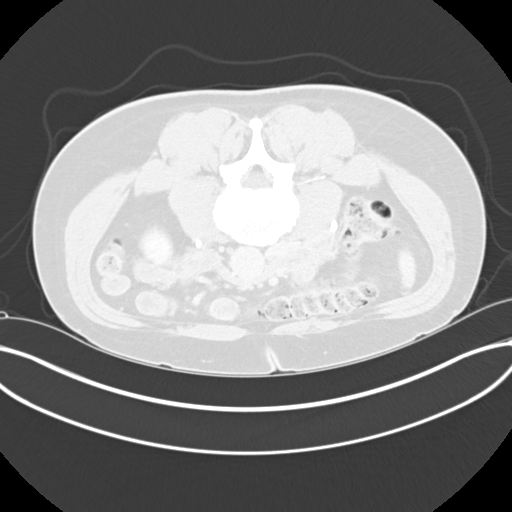
[im 68/95  soft-tissue]
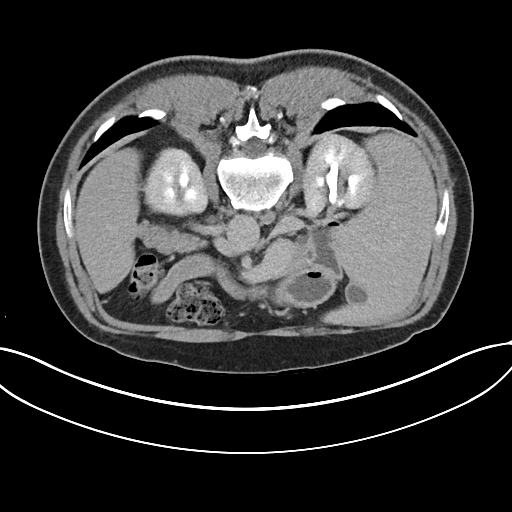
[im 68/95  lung]
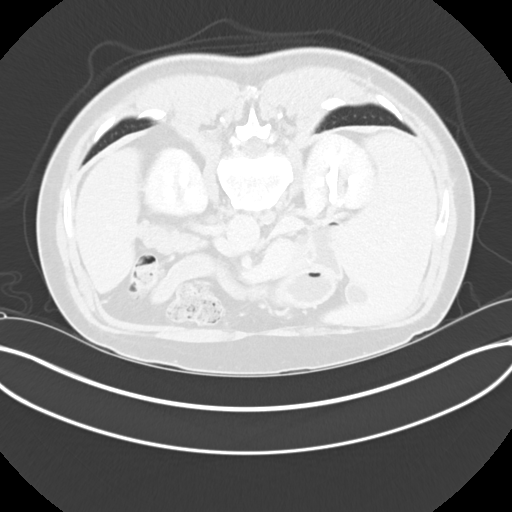
[im 81/95  soft-tissue]
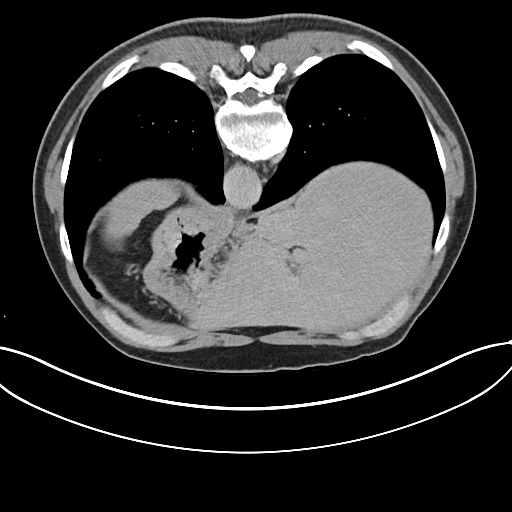
[im 81/95  lung]
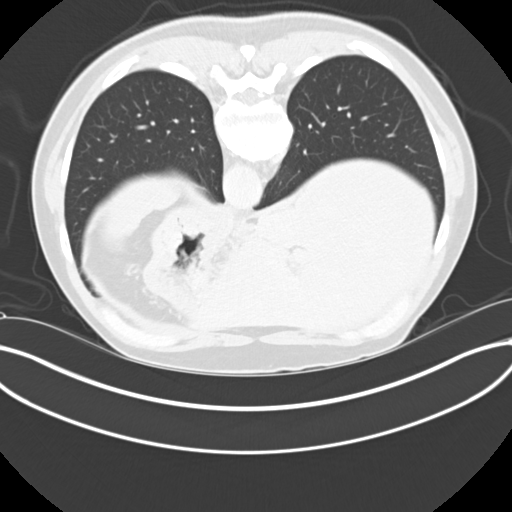

[11 of 46 positions shown; findings below may reference images not displayed]

FINDINGS: Lower chest: Unremarkable.

Hepatobiliary: No suspicious focal abnormality within the liver
parenchyma. There is no evidence for gallstones, gallbladder wall
thickening, or pericholecystic fluid. No intrahepatic or
extrahepatic biliary dilation.

Pancreas: No focal mass lesion. No dilatation of the main duct. No
intraparenchymal cyst. No peripancreatic edema.

Spleen: No splenomegaly. No focal mass lesion.

Adrenals/Urinary Tract: No adrenal nodule or mass.

Precontrast imaging shows no stones in either kidney or ureter no
bladder stones.

Imaging after IV contrast administration shows no suspicious
enhancing lesion in either kidney.

Delayed post-contrast imaging shows no wall thickening or soft
tissue filling defect in either intrarenal collecting system or
renal pelvis. Neither ureter is completely opacified, but there is
no focal hydroureter, evidence for ureteral wall thickening, or
ureteral mass lesion by CT imaging. Subtle bladder wall
trabeculation noted with tiny posterolateral bladder diverticuli
seen bilaterally. By CT imaging. No focal bladder wall abnormality

Stomach/Bowel: Stomach is unremarkable. No gastric wall thickening.
No evidence of outlet obstruction. Duodenum is normally positioned
as is the ligament of Treitz. No small bowel wall thickening. No
small bowel dilatation. The terminal ileum is normal. The appendix
is not visualized, but there is no edema or inflammation in the
region of the cecum. No gross colonic mass. No colonic wall
thickening.

Vascular/Lymphatic: There is abdominal aortic atherosclerosis
without aneurysm. There is no gastrohepatic or hepatoduodenal
ligament lymphadenopathy. No retroperitoneal or mesenteric
lymphadenopathy. No pelvic sidewall lymphadenopathy.

Reproductive: The prostate gland and seminal vesicles are
unremarkable.

Other: No intraperitoneal free fluid.

Musculoskeletal: No worrisome lytic or sclerotic osseous
abnormality.
IMPRESSION: 1. No acute findings in the abdomen or pelvis. Specifically, no
findings to explain the patient's history of hematuria.
2. Subtle bladder wall trabeculation with tiny posterolateral
bladder diverticuli seen bilaterally. Component of underlying
bladder outlet obstruction not excluded.
3. Aortic Atherosclerosis ([id number]).

## 2021-04-29 ENCOUNTER — Encounter: Payer: Self-pay | Admitting: *Deleted

## 2021-04-29 ENCOUNTER — Other Ambulatory Visit: Payer: Self-pay

## 2021-04-29 ENCOUNTER — Telehealth: Payer: Self-pay | Admitting: *Deleted

## 2021-04-29 ENCOUNTER — Encounter: Payer: Self-pay | Admitting: Urology

## 2021-04-29 ENCOUNTER — Ambulatory Visit (INDEPENDENT_AMBULATORY_CARE_PROVIDER_SITE_OTHER): Payer: Self-pay | Admitting: Urology

## 2021-04-29 VITALS — BP 140/63 | HR 85 | Ht 75.0 in | Wt 195.0 lb

## 2021-04-29 DIAGNOSIS — Z87448 Personal history of other diseases of urinary system: Secondary | ICD-10-CM

## 2021-04-29 DIAGNOSIS — N411 Chronic prostatitis: Secondary | ICD-10-CM | POA: Insufficient documentation

## 2021-04-29 DIAGNOSIS — Z87898 Personal history of other specified conditions: Secondary | ICD-10-CM

## 2021-04-29 LAB — URINALYSIS, COMPLETE
Bilirubin, UA: NEGATIVE
Glucose, UA: NEGATIVE
Ketones, UA: NEGATIVE
Leukocytes,UA: NEGATIVE
Nitrite, UA: NEGATIVE
Protein,UA: NEGATIVE
RBC, UA: NEGATIVE
Specific Gravity, UA: >1.03 — ABNORMAL HIGH (ref 1.005–1.030)
Urobilinogen, Ur: 1 mg/dL (ref 0.2–1.0)
pH, UA: 5 (ref 5.0–7.5)

## 2021-04-29 LAB — MICROSCOPIC EXAMINATION: Bacteria, UA: NONE SEEN

## 2021-04-29 NOTE — Progress Notes (Signed)
   04/29/2021 9:51 AM   Travis Pacheco. 06/04/1951 [id number]  Referring provider: [name], MD [address] [place],  [place] [address]  Chief Complaint  Patient presents with   Hematuria    Urologic history: 1.  Recurrent prostatitis Episode dysuria, malaise mild gross hematuria 08/2020 which resolved with antibiotics CTU unremarkable   HPI: 70 y.o. male presents for 6 month follow-up.  No complaints since last visit and specifically denies recurrent prostatitis symptoms or gross hematuria Denies flank, abdominal or pelvic pain No bothersome LUTS   PMH: Past Medical History:  Diagnosis Date   GERD (gastroesophageal reflux disease)     Surgical History: Past Surgical History:  Procedure Laterality Date   FINGER SURGERY     left 5th finger ---twice as child. chronic contracture    Home Medications:  Allergies as of 04/29/2021   No Known Allergies      Medication List    as of April 29, 2021  9:51 AM   You have not been prescribed any medications.     Allergies: No Known Allergies  Family History: Family History  Problem Relation Age of Onset   Breast cancer Mother    Heart disease Father    Breast cancer Sister    Diabetes Neg Hx     Social History:  reports that he quit smoking about 44 years ago. He has never used smokeless tobacco. He reports current alcohol use. No history on file for drug use.   Physical Exam: BP 140/63   Pulse 85   Ht 6\' 3"  (1.905 m)   Wt 195 lb (88.5 kg)   BMI 24.37 kg/m   Constitutional:  Alert and oriented, No acute distress. HEENT: [place] AT, moist mucus membranes.  Trachea midline, no masses. Cardiovascular: No clubbing, cyanosis, or edema. Respiratory: Normal respiratory effort, no increased work of breathing.  Laboratory Data:  Urinalysis Dipstick/microscopy negative  Assessment & Plan:    1.  Recurrent prostatitis No recurrent symptoms  2.  History gross hematuria Urinalysis today  is clear Follow-up 1 year for repeat UA and instructed to call earlier for recurrent symptoms  3.  Prostate cancer screening No PSA on record review after he left He will be contacted regarding PSA testing   , MD  [facility] [address], [address] [place], [place] [phone]

## 2021-04-29 NOTE — Telephone Encounter (Signed)
-----   Message from [name], MD sent at 04/29/2021 10:17 AM EDT ----- Regarding: Labs Can let patient know his urinalysis was normal.  Please schedule I year follow-up.  Also on chart review he has not had a PSA.  If he desires to have prostate cancer screening can schedule lab visit for PSA.  Thanks

## 2021-04-29 NOTE — Telephone Encounter (Signed)
Notified patient as instructed, left message on voice mail and sent a my chart message

## 2021-10-25 ENCOUNTER — Encounter: Payer: Self-pay | Admitting: Urology

## 2022-03-29 ENCOUNTER — Telehealth: Payer: Self-pay | Admitting: Internal Medicine

## 2022-03-29 MED ORDER — NIRMATRELVIR/RITONAVIR (PAXLOVID)TABLET: 3.0000 | ORAL_TABLET | Freq: Two times a day (BID) | ORAL | 0 refills | Status: AC

## 2022-03-29 NOTE — Telephone Encounter (Signed)
Contacted me today ?Got hit hard with respiratory illness last night ?Some better today ?Positive COVID test this morning ? ?Not severe--no trouble breathing, etc ?Normal renal function ?No other meds ? ?Will Rx paxlovid ?Supportive care ?ER if dyspnea, etc ?

## 2022-05-01 ENCOUNTER — Ambulatory Visit: Payer: Self-pay | Admitting: Urology

## 2022-05-03 ENCOUNTER — Ambulatory Visit: Payer: Self-pay | Admitting: Urology

## 2022-05-05 ENCOUNTER — Encounter: Payer: Self-pay | Admitting: Urology

## 2022-05-10 ENCOUNTER — Encounter: Payer: Self-pay | Admitting: Internal Medicine

## 2022-05-10 ENCOUNTER — Ambulatory Visit (INDEPENDENT_AMBULATORY_CARE_PROVIDER_SITE_OTHER): Payer: Medicare Other | Admitting: Internal Medicine

## 2022-05-10 VITALS — BP 100/70 | HR 77 | Temp 97.7°F | Ht 76.0 in | Wt 186.0 lb

## 2022-05-10 DIAGNOSIS — N644 Mastodynia: Secondary | ICD-10-CM | POA: Insufficient documentation

## 2022-05-10 LAB — CBC
HCT: 42.6 % (ref 39.0–52.0)
Hemoglobin: 14.4 g/dL (ref 13.0–17.0)
MCHC: 33.7 g/dL (ref 30.0–36.0)
MCV: 99.8 fl (ref 78.0–100.0)
Platelets: 95 10*3/uL — ABNORMAL LOW (ref 150.0–400.0)
RBC: 4.27 Mil/uL (ref 4.22–5.81)
RDW: 13.5 % (ref 11.5–15.5)
WBC: 4 10*3/uL (ref 4.0–10.5)

## 2022-05-10 LAB — COMPREHENSIVE METABOLIC PANEL
ALT: 124 U/L — ABNORMAL HIGH (ref 0–53)
AST: 199 U/L — ABNORMAL HIGH (ref 0–37)
Albumin: 4 g/dL (ref 3.5–5.2)
Alkaline Phosphatase: 65 U/L (ref 39–117)
BUN: 15 mg/dL (ref 6–23)
CO2: 31 mEq/L (ref 19–32)
Calcium: 9.6 mg/dL (ref 8.4–10.5)
Chloride: 103 mEq/L (ref 96–112)
Creatinine, Ser: 0.73 mg/dL (ref 0.40–1.50)
GFR: 92.69 mL/min (ref >60.00)
Glucose, Bld: 108 mg/dL — ABNORMAL HIGH (ref 70–99)
Potassium: 3.9 mEq/L (ref 3.5–5.1)
Sodium: 140 mEq/L (ref 135–145)
Total Bilirubin: 1.2 mg/dL (ref 0.2–1.2)
Total Protein: 7.7 g/dL (ref 6.0–8.3)

## 2022-05-10 NOTE — Progress Notes (Signed)
   Subjective:    Patient ID: Travis Skains., male    DOB: 11/06/1951, 71 y.o.   MRN: [id number]  HPI Here due to painful lump in right breast  Has taken some time off of work---changes, etc Now back some---but reducing workload since he was working all the time  Noticed lump in right breast about 2 weeks ago Large and tender No nipple discharge  Mom and sister died of breast cancer---not sure of BRCA status  No current outpatient medications on file prior to visit.   No current facility-administered medications on file prior to visit.    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    Family History  Problem Relation Age of Onset   Breast cancer Mother    Heart disease Father    Breast cancer Sister    Diabetes Neg Hx     Social History   Socioeconomic History   Marital status: Single    Spouse name: Not on file   Number of children: 2   Years of education: Not on file   Highest education level: Not on file  Occupational History   Occupation: Architect    Comment: own business   Occupation: Astronomer    Comment: Engineer, drilling  Tobacco Use   Smoking status: Former    Types: Cigarettes    Quit date: 1978    Years since quitting: 45.5    Passive exposure: Past (as a child)   Smokeless tobacco: Never  Substance and Sexual Activity   Alcohol use: Yes   Drug use: Not on file   Sexual activity: Not on file  Other Topics Concern   Not on file  Social History Narrative   1 son and 1 daughter   Social Determinants of Health   Financial Resource Strain: Not on file  Food Insecurity: Not on file  Transportation Needs: Not on file  Physical Activity: Not on file  Stress: Not on file  Social Connections: Not on file  Intimate Partner Violence: Not on  file   Review of Systems Has lost 9# in the past year--not eating as much Physically active with general construction work Stopped drinking coffee--and drinking a lot of energy drinks    Objective:   Physical Exam Constitutional:      Appearance: Normal appearance.  Genitourinary:    Comments: Asymmetry in breasts---visible enlargement of right breast No masses No tenderness No nipple discharge Neurological:     Mental Status: He is alert.            Assessment & Plan:

## 2022-05-10 NOTE — Assessment & Plan Note (Signed)
No discrete mass so no reason for mammogram Discussed cutting back on the energy drinks and caffeine Does drink alcohol--but doubt liver cause of this Will check labs---CBC, met C, prolactin

## 2022-05-11 ENCOUNTER — Other Ambulatory Visit: Payer: Self-pay | Admitting: Internal Medicine

## 2022-05-11 DIAGNOSIS — R7989 Other specified abnormal findings of blood chemistry: Secondary | ICD-10-CM

## 2022-05-11 LAB — PROLACTIN: Prolactin: 6.4 ng/mL (ref 2.0–18.0)

## 2022-05-11 NOTE — Progress Notes (Signed)
.  li

## 2022-07-03 ENCOUNTER — Telehealth: Payer: Self-pay | Admitting: Internal Medicine

## 2022-07-03 NOTE — Telephone Encounter (Signed)
Please call him  I have spoken to him to make sure he realizes that I think the ultrasound is important (of his liver). He still hasn't set this up.  Please make sure that I want to confirm there is no mass, or other concerning finding that needs immediate attention. While this is not likely, it is important to make sure we are not missing something important.

## 2022-07-03 NOTE — Telephone Encounter (Signed)
Spoke to pt. He was concerned about the cost. [place] Spaniel he had an abdominal scan last year that cost him $6000 and asked if the abnormality was seen in it it. I advised he had a CT Scan that looked at different organs than the [place] that has been scheduled. I advised the [place] should not be $6000. Advised that an [place] is not as in depth as a CT Scan. Explained its the scan they do for pregnant women. He said he will call and set up the appt.

## 2023-03-15 ENCOUNTER — Encounter: Payer: Self-pay | Admitting: Internal Medicine

## 2023-03-15 ENCOUNTER — Ambulatory Visit (INDEPENDENT_AMBULATORY_CARE_PROVIDER_SITE_OTHER): Payer: Medicare Other | Admitting: Internal Medicine

## 2023-03-15 VITALS — BP 90/60 | HR 102 | Temp 97.4°F | Ht 76.0 in | Wt 182.0 lb

## 2023-03-15 DIAGNOSIS — L409 Psoriasis, unspecified: Secondary | ICD-10-CM

## 2023-03-15 DIAGNOSIS — F1020 Alcohol dependence, uncomplicated: Secondary | ICD-10-CM | POA: Diagnosis not present

## 2023-03-15 DIAGNOSIS — K7010 Alcoholic hepatitis without ascites: Secondary | ICD-10-CM | POA: Diagnosis not present

## 2023-03-15 MED ORDER — TRIAMCINOLONE ACETONIDE 0.1 % EX CREA: 1.0000 | TOPICAL_CREAM | Freq: Two times a day (BID) | CUTANEOUS | 1 refills | Status: DC | PRN

## 2023-03-15 MED ORDER — BUPROPION HCL ER (SR) 150 MG PO TB12: 150.0000 mg | ORAL_TABLET | Freq: Two times a day (BID) | ORAL | 5 refills | Status: DC

## 2023-03-15 MED ORDER — THIAMINE HCL 100 MG PO TABS: 100.0000 mg | ORAL_TABLET | Freq: Every day | ORAL | 1 refills | Status: DC

## 2023-03-15 MED ORDER — CHLORDIAZEPOXIDE HCL 25 MG PO CAPS: 25.0000 mg | ORAL_CAPSULE | Freq: Four times a day (QID) | ORAL | 0 refills | Status: DC | PRN

## 2023-03-15 NOTE — Patient Instructions (Addendum)
Chlordiazepoxide dosing----- Day 1---- 2 capsules four times a day                                                Day 2 --- 2 capsules three times a day                                               Day 3--- 2 capsules twice a day                                                Day 4--2 capsules once Don't take if you are feeling sleepy already  After that you can take one if your nerves are bad.

## 2023-03-15 NOTE — Assessment & Plan Note (Signed)
Fluocinolde especially effective but too expensive Will give TAC

## 2023-03-15 NOTE — Assessment & Plan Note (Signed)
Probably heavy over the past 12 years---but really excessive after girlfriend died 4 months ago Is set up with a detox center Will give Rx for chlordiazepoxide to prevent acute withdrawal Thiamine 100mg  daily Will give bupropion 150mg  bid Consider depade at follow up --but unclear if he will need this (seems very motivated)

## 2023-03-15 NOTE — Assessment & Plan Note (Signed)
He wants to avoid a lot of medical stuff Will plan to check labs in 2 weeks---if ongoing findings, etc ---will proceed with ultrasound and fibrosure
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
## 2023-03-29 ENCOUNTER — Encounter: Payer: Self-pay | Admitting: Internal Medicine

## 2023-03-29 ENCOUNTER — Ambulatory Visit: Payer: Medicare Other | Admitting: Internal Medicine

## 2023-03-29 ENCOUNTER — Ambulatory Visit (INDEPENDENT_AMBULATORY_CARE_PROVIDER_SITE_OTHER): Payer: Medicare Other | Admitting: Internal Medicine

## 2023-03-29 VITALS — BP 100/62 | HR 86 | Temp 97.3°F | Ht 76.0 in | Wt 185.0 lb

## 2023-03-29 DIAGNOSIS — F1020 Alcohol dependence, uncomplicated: Secondary | ICD-10-CM | POA: Diagnosis not present

## 2023-03-29 DIAGNOSIS — R7989 Other specified abnormal findings of blood chemistry: Secondary | ICD-10-CM | POA: Diagnosis not present

## 2023-03-29 DIAGNOSIS — K7010 Alcoholic hepatitis without ascites: Secondary | ICD-10-CM | POA: Diagnosis not present

## 2023-03-29 DIAGNOSIS — F39 Unspecified mood [affective] disorder: Secondary | ICD-10-CM

## 2023-03-29 LAB — RENAL FUNCTION PANEL
Albumin: 3.6 g/dL (ref 3.5–5.2)
BUN: 14 mg/dL (ref 6–23)
CO2: 28 mEq/L (ref 19–32)
Calcium: 9.3 mg/dL (ref 8.4–10.5)
Chloride: 103 mEq/L (ref 96–112)
Creatinine, Ser: 1.02 mg/dL (ref 0.40–1.50)
GFR: 74.41 mL/min (ref >60.00)
Glucose, Bld: 107 mg/dL — ABNORMAL HIGH (ref 70–99)
Phosphorus: 3.5 mg/dL (ref 2.3–4.6)
Potassium: 3.9 mEq/L (ref 3.5–5.1)
Sodium: 138 mEq/L (ref 135–145)

## 2023-03-29 LAB — CBC
HCT: 39.1 % (ref 39.0–52.0)
Hemoglobin: 13.4 g/dL (ref 13.0–17.0)
MCHC: 34.2 g/dL (ref 30.0–36.0)
MCV: 102.2 fl — ABNORMAL HIGH (ref 78.0–100.0)
Platelets: 126 10*3/uL — ABNORMAL LOW (ref 150.0–400.0)
RBC: 3.83 Mil/uL — ABNORMAL LOW (ref 4.22–5.81)
RDW: 12.7 % (ref 11.5–15.5)
WBC: 5.2 10*3/uL (ref 4.0–10.5)

## 2023-03-29 LAB — HEPATIC FUNCTION PANEL
ALT: 30 U/L (ref 0–53)
AST: 35 U/L (ref 0–37)
Albumin: 3.6 g/dL (ref 3.5–5.2)
Alkaline Phosphatase: 55 U/L (ref 39–117)
Bilirubin, Direct: 0.2 mg/dL (ref 0.0–0.3)
Total Bilirubin: 0.5 mg/dL (ref 0.2–1.2)
Total Protein: 7.2 g/dL (ref 6.0–8.3)

## 2023-03-29 LAB — PROTIME-INR
INR: 1.2 ratio — ABNORMAL HIGH (ref 0.8–1.0)
Prothrombin Time: 12.4 s (ref 9.6–13.1)

## 2023-03-29 NOTE — Progress Notes (Signed)
Subjective:    Patient ID: Travis Pacheco., male    DOB: 07-31-1951, 72 y.o.   MRN: [id number]  HPI Here for follow up after alcohol detox  Has stopped the alcohol now His appetite is back! Excited about this Not tempted by alcohol Is working with friend who runs alcohol rehab---will do outpatient program with her (and he speaks to her daily)  Had trouble with the bupropion Made him see double and feel off balance Still with some grieving---but not daily severe depression  Now back to work  Current Outpatient Medications on File Prior to Visit  Medication Sig Dispense Refill   Multiple Vitamin (MULTIVITAMIN) capsule Take 1 capsule by mouth daily.     thiamine (VITAMIN B1) 100 MG tablet Take 1 tablet (100 mg total) by mouth daily. 30 tablet 1   triamcinolone cream (KENALOG) 0.1 % Apply 1 Application topically 2 (two) times daily as needed. 45 g 1   No current facility-administered medications on file prior to visit.    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    Family History  Problem Relation Age of Onset   Breast cancer Mother    Heart disease Father    Breast cancer Sister    Diabetes Neg Hx     Social History   Socioeconomic History   Marital status: Single    Spouse name: Not on file   Number of children: 2   Years of education: Not on file   Highest education level: Not on file  Occupational History   Occupation: Holiday representative    Comment: own business  Tobacco Use   Smoking status: Former    Types: Cigarettes    Quit date: 1978    Years since quitting: 46.4    Passive exposure: Past (as a child)   Smokeless tobacco: Never  Substance and Sexual Activity   Alcohol use: Yes   Drug use: Not on file   Sexual activity: Not on file  Other Topics Concern    Not on file  Social History Narrative   1 son and 1 daughter   Social Determinants of Health   Financial Resource Strain: Not on file  Food Insecurity: Not on file  Transportation Needs: Not on file  Physical Activity: Not on file  Stress: Not on file  Social Connections: Not on file  Intimate Partner Violence: Not on file   Review of Systems Sleeps well Weight up slightly     Objective:   Physical Exam Constitutional:      Appearance: Normal appearance.  Cardiovascular:     Rate and Rhythm: Normal rate and regular rhythm.     Heart sounds: No murmur heard.    No gallop.  Pulmonary:     Effort: Pulmonary effort is normal.     Breath sounds: Normal breath sounds. No wheezing or rales.  Abdominal:     Palpations: Abdomen is soft.     Tenderness: There is no abdominal tenderness.     Comments: Liver no longer enlarged or tender  Musculoskeletal:     Cervical back: Neck supple.     Right lower leg: No edema.     Left lower leg: No edema.  Lymphadenopathy:     Cervical: No cervical adenopathy.  Neurological:  Mental Status: He is alert.  Psychiatric:        Mood and Affect: Mood normal.        Behavior: Behavior normal.            Assessment & Plan:

## 2023-03-29 NOTE — Assessment & Plan Note (Signed)
Seems better now Will check labs to be sure no evidence of cirrhosis

## 2023-03-29 NOTE — Assessment & Plan Note (Signed)
Didn't tolerate bupropion Still grieving but doing better now

## 2023-03-29 NOTE — Assessment & Plan Note (Signed)
Has detoxed without apparent complications Off the chlordiazepoxide now Involved with rehab facility

## 2023-03-30 LAB — NASH FIBROSURE(R) PLUS

## 2023-04-04 ENCOUNTER — Other Ambulatory Visit: Payer: Self-pay | Admitting: Internal Medicine

## 2023-04-04 DIAGNOSIS — K7030 Alcoholic cirrhosis of liver without ascites: Secondary | ICD-10-CM

## 2023-04-04 LAB — NASH FIBROSURE(R) PLUS
ALPHA 2-MACROGLOBULINS, QN: 248 mg/dL (ref 110–276)
ALT (SGPT) P5P: 37 IU/L (ref 0–55)
AST (SGOT) P5P: 46 IU/L — ABNORMAL HIGH (ref 0–40)
Apolipoprotein A-1: 109 mg/dL (ref 101–178)
Bilirubin, Total: 0.4 mg/dL (ref 0.0–1.2)
Cholesterol, Total: 110 mg/dL (ref 100–199)
Fibrosis Score: 0.7 — ABNORMAL HIGH (ref 0.00–0.21)
GGT: 222 IU/L — ABNORMAL HIGH (ref 0–65)
Glucose: 113 mg/dL — ABNORMAL HIGH (ref 70–99)
Haptoglobin: 146 mg/dL (ref 32–363)
NASH Score: 0.83 — ABNORMAL HIGH (ref 0.00–0.25)
Steatosis Score: 0.71 — ABNORMAL HIGH (ref 0.00–0.40)
Triglycerides: 79 mg/dL (ref 0–149)

## 2023-04-04 LAB — FIB-4 W/RX NASH FIBROSURE PLUS
ALT: 34 IU/L (ref 0–44)
AST: 37 IU/L (ref 0–40)
FIB-4 Index: 3.64 — ABNORMAL HIGH (ref 0.00–2.67)
Platelets: 122 10*3/uL — ABNORMAL LOW (ref 150–450)

## 2023-04-23 ENCOUNTER — Encounter: Payer: Self-pay | Admitting: Internal Medicine

## 2023-04-23 ENCOUNTER — Ambulatory Visit (INDEPENDENT_AMBULATORY_CARE_PROVIDER_SITE_OTHER): Payer: Medicare Other | Admitting: Internal Medicine

## 2023-04-23 VITALS — BP 98/58 | HR 84 | Temp 97.4°F | Ht 76.0 in | Wt 180.0 lb

## 2023-04-23 DIAGNOSIS — K7030 Alcoholic cirrhosis of liver without ascites: Secondary | ICD-10-CM

## 2023-04-23 DIAGNOSIS — F39 Unspecified mood [affective] disorder: Secondary | ICD-10-CM

## 2023-04-23 NOTE — Progress Notes (Signed)
   Subjective:    Patient ID: Travis Rainbow., male    DOB: Dec 19, 1950, 72 y.o.   MRN: [id number]  HPI Here for follow up of cirrhosis, etc  Had not been taking the bupropion correctly ---might have taken too many (or he confused side effects with the librium) Went back to bupropion twice a day Has remained abstinent---even on vacation with others who were drinking He feels it has "mellowed him out" back on it Able to get up easier---and now back at work  Is participating in alcohol program--in person  Current Outpatient Medications on File Prior to Visit  Medication Sig Dispense Refill   buPROPion (WELLBUTRIN SR) 150 MG 12 hr tablet Take 150 mg by mouth 2 (two) times daily.     Multiple Vitamin (MULTIVITAMIN) capsule Take 1 capsule by mouth daily.     thiamine (VITAMIN B1) 100 MG tablet Take 1 tablet (100 mg total) by mouth daily. 30 tablet 1   triamcinolone cream (KENALOG) 0.1 % Apply 1 Application topically 2 (two) times daily as needed. 45 g 1   No current facility-administered medications on file prior to visit.    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    Family History  Problem Relation Age of Onset   Breast cancer Mother    Heart disease Father    Breast cancer Sister    Diabetes Neg Hx     Social History   Socioeconomic History   Marital status: Single    Spouse name: Not on file   Number of children: 2   Years of education: Not on file   Highest education level: Not on file  Occupational History   Occupation: Holiday representative    Comment: own business  Tobacco Use   Smoking status: Former    Types: Cigarettes    Quit date: 1978    Years since quitting: 46.4    Passive exposure: Past (as a child)   Smokeless tobacco: Never  Substance and Sexual Activity    Alcohol use: Yes   Drug use: Not on file   Sexual activity: Not on file  Other Topics Concern   Not on file  Social History Narrative   1 son and 1 daughter   Social Determinants of Health   Financial Resource Strain: Not on file  Food Insecurity: Not on file  Transportation Needs: Not on file  Physical Activity: Not on file  Stress: Not on file  Social Connections: Not on file  Intimate Partner Violence: Not on file   Review of Systems Appetite is improved Weight down slightly Drinks a lot of gatorade--for hydration    Objective:   Physical Exam         Assessment & Plan:

## 2023-04-23 NOTE — Assessment & Plan Note (Signed)
Mood is much better now Is on the bupropion 150 bid--will continue

## 2023-04-23 NOTE — Assessment & Plan Note (Signed)
Has remained abstinent Is going to get ultrasound---cancer screening

## 2023-04-26 ENCOUNTER — Ambulatory Visit
Admission: RE | Admit: 2023-04-26 | Discharge: 2023-04-26 | Disposition: A | Discharge status: Home / Self Care | Payer: Medicare Other | Source: Ambulatory Visit | Attending: Internal Medicine | Admitting: Internal Medicine

## 2023-04-26 DIAGNOSIS — K7030 Alcoholic cirrhosis of liver without ascites: Secondary | ICD-10-CM | POA: Diagnosis present

## 2023-06-13 ENCOUNTER — Encounter (INDEPENDENT_AMBULATORY_CARE_PROVIDER_SITE_OTHER): Payer: Self-pay

## 2023-06-29 ENCOUNTER — Encounter: Payer: Medicare Other | Admitting: Internal Medicine

## 2023-07-02 ENCOUNTER — Encounter: Payer: Self-pay | Admitting: Internal Medicine

## 2023-11-19 ENCOUNTER — Ambulatory Visit (INDEPENDENT_AMBULATORY_CARE_PROVIDER_SITE_OTHER): Payer: Medicare Other | Admitting: Internal Medicine

## 2023-11-19 VITALS — BP 112/68 | HR 97 | Temp 98.4°F | Ht 76.0 in | Wt 168.0 lb

## 2023-11-19 DIAGNOSIS — F1020 Alcohol dependence, uncomplicated: Secondary | ICD-10-CM

## 2023-11-19 DIAGNOSIS — J988 Other specified respiratory disorders: Secondary | ICD-10-CM

## 2023-11-19 DIAGNOSIS — R200 Anesthesia of skin: Secondary | ICD-10-CM | POA: Diagnosis not present

## 2023-11-19 DIAGNOSIS — N400 Enlarged prostate without lower urinary tract symptoms: Secondary | ICD-10-CM | POA: Insufficient documentation

## 2023-11-19 DIAGNOSIS — K7030 Alcoholic cirrhosis of liver without ascites: Secondary | ICD-10-CM

## 2023-11-19 DIAGNOSIS — N401 Enlarged prostate with lower urinary tract symptoms: Secondary | ICD-10-CM

## 2023-11-19 LAB — HEPATIC FUNCTION PANEL
ALT: 21 U/L (ref 0–53)
AST: 22 U/L (ref 0–37)
Albumin: 4 g/dL (ref 3.5–5.2)
Alkaline Phosphatase: 96 U/L (ref 39–117)
Bilirubin, Direct: 0.2 mg/dL (ref 0.0–0.3)
Total Bilirubin: 0.7 mg/dL (ref 0.2–1.2)
Total Protein: 7.3 g/dL (ref 6.0–8.3)

## 2023-11-19 LAB — RENAL FUNCTION PANEL
Albumin: 4 g/dL (ref 3.5–5.2)
BUN: 14 mg/dL (ref 6–23)
CO2: 33 meq/L — ABNORMAL HIGH (ref 19–32)
Calcium: 9.7 mg/dL (ref 8.4–10.5)
Chloride: 97 meq/L (ref 96–112)
Creatinine, Ser: 0.96 mg/dL (ref 0.40–1.50)
GFR: 79.67 mL/min (ref >60.00)
Glucose, Bld: 89 mg/dL (ref 70–99)
Phosphorus: 4.1 mg/dL (ref 2.3–4.6)
Potassium: 4.4 meq/L (ref 3.5–5.1)
Sodium: 136 meq/L (ref 135–145)

## 2023-11-19 LAB — CBC
HCT: 41.7 % (ref 39.0–52.0)
Hemoglobin: 14.1 g/dL (ref 13.0–17.0)
MCHC: 33.9 g/dL (ref 30.0–36.0)
MCV: 94.9 fL (ref 78.0–100.0)
Platelets: 156 10*3/uL (ref 150.0–400.0)
RBC: 4.4 Mil/uL (ref 4.22–5.81)
RDW: 12.6 % (ref 11.5–15.5)
WBC: 7.2 10*3/uL (ref 4.0–10.5)

## 2023-11-19 LAB — TSH: TSH: 1.5 u[IU]/mL (ref 0.35–5.50)

## 2023-11-19 LAB — VITAMIN B12: Vitamin B-12: 507 pg/mL (ref 211–911)

## 2023-11-19 MED ORDER — DOXYCYCLINE HYCLATE 100 MG PO TABS: 100.0000 mg | ORAL_TABLET | Freq: Two times a day (BID) | ORAL | 0 refills | Status: DC

## 2023-11-19 NOTE — Assessment & Plan Note (Signed)
 No further drinking Will consider repeat ultrasound at next visit

## 2023-11-19 NOTE — Assessment & Plan Note (Signed)
 Will check labs Urged him to restart the multivitamin

## 2023-11-19 NOTE — Assessment & Plan Note (Signed)
 Has remained abstinent for 10 months

## 2023-11-19 NOTE — Progress Notes (Signed)
 Subjective:    Patient ID: Travis Pacheco., male    DOB: 12-23-1950, 73 y.o.   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     triamcinolone  cream (KENALOG ) 0.1 % Apply 1 Application topically 2 (two) times daily as needed. 45 g 1   No current facility-administered medications on file prior to visit.    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    Family History  Problem Relation Age of Onset   Breast cancer Mother    Heart disease Father    Breast cancer Sister    Diabetes Neg Hx     Social History   Socioeconomic History   Marital status: Single    Spouse name: Not on file   Number of children: 2   Years of education: Not on file   Highest education level: Associate degree: occupational, scientist, product/process development, or vocational program  Occupational History   Occupation: Holiday Representative    Comment: own business  Tobacco  Use   Smoking status: Former    Current packs/day: 0.00    Types: Cigarettes    Quit date: 1978    Years since quitting: 47.0    Passive exposure: Past (as a child)   Smokeless tobacco: Never  Substance and Sexual Activity   Alcohol use: Yes   Drug use: Not on file   Sexual activity: Not on file  Other Topics Concern   Not on file  Social History Narrative   1 son and 1 daughter   Social Drivers of Corporate Investment Banker Strain: High Risk (11/19/2023)   Overall Financial Resource Strain (CARDIA)    Difficulty of Paying Living Expenses: Very hard  Food Insecurity: No Food Insecurity (11/19/2023)   Hunger Vital Sign    Worried About Running Out of Food in the Last Year: Never true    Ran Out of Food in the Last Year: Never true  Transportation Needs: No Transportation Needs (11/19/2023)   PRAPARE - Administrator, Civil Service (Medical): No    Lack of Transportation (Non-Medical): No  Physical Activity: Insufficiently Active (11/19/2023)   Exercise Vital Sign    Days of Exercise per Week: 1 day    Minutes of Exercise per Session: 20 min  Stress: Stress Concern Present (11/19/2023)   [employer] of Occupational Health - Occupational Stress Questionnaire    Feeling of Stress : To some extent  Social Connections: Moderately Isolated (11/19/2023)  Social Advertising Account Executive [NHANES]    Frequency of Communication with Friends and Family: More than three times a week    Frequency of Social Gatherings with Friends and Family: More than three times a week    Attends Religious Services: Never    Database Administrator or Organizations: Yes    Attends Engineer, Structural: 1 to 4 times per year    Marital Status: Divorced  Catering Manager Violence: Not on file   Review of Systems Has lost weight --he was surprised at weight here Eating is spotty--appetite is not big--but has been binging on ice cream at times     Objective:   Physical  Exam Constitutional:      Appearance: Normal appearance.  HENT:     Right Ear: Tympanic membrane and ear canal normal.     Left Ear: Tympanic membrane and ear canal normal.     Mouth/Throat:     Pharynx: No oropharyngeal exudate or posterior oropharyngeal erythema.  Cardiovascular:     Rate and Rhythm: Normal rate and regular rhythm.     Pulses: Normal pulses.     Heart sounds: No murmur heard.    No gallop.  Pulmonary:     Effort: Pulmonary effort is normal.     Breath sounds: Normal breath sounds. No wheezing or rales.  Abdominal:     Palpations: Abdomen is soft.     Tenderness: There is no abdominal tenderness.  Skin:    Comments: Scattered psoriatic lesions--mostly arms  3 black toenails--both great and left #4 No other fungal nails  Neurological:     Mental Status: He is alert.  Psychiatric:        Mood and Affect: Mood normal.        Behavior: Behavior normal.            Assessment & Plan:

## 2023-11-19 NOTE — Assessment & Plan Note (Signed)
 Well over a week Will try empiric Rx with doxycycline 100mg  bid x 7 days

## 2023-11-19 NOTE — Assessment & Plan Note (Signed)
 Going back to urology---will likely start tamsulosin

## 2023-11-20 DIAGNOSIS — L400 Psoriasis vulgaris: Secondary | ICD-10-CM | POA: Diagnosis not present

## 2023-11-20 DIAGNOSIS — L218 Other seborrheic dermatitis: Secondary | ICD-10-CM | POA: Diagnosis not present

## 2023-11-22 ENCOUNTER — Encounter: Payer: Self-pay | Admitting: Urology

## 2023-11-22 ENCOUNTER — Ambulatory Visit: Payer: Medicare Other | Admitting: Urology

## 2023-11-22 VITALS — BP 111/64 | HR 85 | Ht 76.0 in | Wt 168.0 lb

## 2023-11-22 DIAGNOSIS — N401 Enlarged prostate with lower urinary tract symptoms: Secondary | ICD-10-CM | POA: Diagnosis not present

## 2023-11-22 DIAGNOSIS — R350 Frequency of micturition: Secondary | ICD-10-CM | POA: Diagnosis not present

## 2023-11-22 LAB — URINALYSIS, COMPLETE
Bilirubin, UA: NEGATIVE
Glucose, UA: NEGATIVE
Leukocytes,UA: NEGATIVE
Nitrite, UA: NEGATIVE
Protein,UA: NEGATIVE
RBC, UA: NEGATIVE
Specific Gravity, UA: >1.03 — ABNORMAL HIGH (ref 1.005–1.030)
Urobilinogen, Ur: 1 mg/dL (ref 0.2–1.0)
pH, UA: 5.5 (ref 5.0–7.5)

## 2023-11-22 LAB — MICROSCOPIC EXAMINATION

## 2023-11-22 LAB — BLADDER SCAN AMB NON-IMAGING: Scan Result: 36

## 2023-11-22 MED ORDER — TAMSULOSIN HCL 0.4 MG PO CAPS: 0.4000 mg | ORAL_CAPSULE | Freq: Every day | ORAL | 2 refills | Status: DC

## 2023-11-22 NOTE — Progress Notes (Signed)
 I, Maysun LITTIE Griffiths, acting as a scribe for Glendia JAYSON Barba, MD., have documented all relevant documentation on the behalf of Glendia JAYSON Barba, MD, as directed by Glendia JAYSON Barba, MD while in the presence of Glendia JAYSON Barba, MD.  11/22/2023 3:33 PM   Travis Pacheco. 07-24-1951 [id number]  Referring provider: Jimmy Charlie FERNS, MD [address] [place],  [place] [address]  Chief Complaint  Patient presents with   Prostatitis    HPI: Travis Pacheco. is a 73 y.o. male presents for evaluation of bothersome lower urinary tract symptoms.   Previously seen 2021-2022 with a history of prostatitis and hematuria with negative evaluation.  He presents today with a 2 month history of urinary frequency and urgency, which he feels like he voids every 15 minutes. He also gets up multiple times per night to void.  Denies dysuria or gross hematuria.  He does have a weak urinary stream.  No fever or chills. IPSS today 30/35   PMH: Past Medical History:  Diagnosis Date   GERD (gastroesophageal reflux disease)     Surgical History: Past Surgical History:  Procedure Laterality Date   FINGER SURGERY     left 5th finger ---twice as child. chronic contracture    Home Medications:  Allergies as of 11/22/2023   No Known Allergies      Medication List        Accurate as of November 22, 2023  3:33 PM. If you have any questions, ask your nurse or doctor.          doxycycline  100 MG tablet Commonly known as: VIBRA -TABS Take 1 tablet (100 mg total) by mouth 2 (two) times daily.   multivitamin capsule Take 1 capsule by mouth daily.   tamsulosin  0.4 MG Caps capsule Commonly known as: FLOMAX  Take 1 capsule (0.4 mg total) by mouth daily. Started by: Glendia JAYSON Barba   triamcinolone  cream 0.1 % Commonly known as: KENALOG  Apply 1 Application topically 2 (two) times daily as needed.        Allergies: No Known Allergies  Family History: Family History  Problem  Relation Age of Onset   Breast cancer Mother    Heart disease Father    Breast cancer Sister    Diabetes Neg Hx     Social History:  reports that he quit smoking about 47 years ago. His smoking use included cigarettes. He has been exposed to tobacco smoke. He has never used smokeless tobacco. He reports current alcohol use. No history on file for drug use.   Physical Exam: BP 111/64   Pulse 85   Ht 6' 4 (1.93 m)   Wt 168 lb (76.2 kg)   BMI 20.45 kg/m   Constitutional:  Alert and oriented, No acute distress. HEENT:  AT. Respiratory: Normal respiratory effort, no increased work of breathing. GU: Prostate 60 grams, smooth without nodules.  Skin: No rashes, bruises or suspicious lesions. Neurologic: Grossly intact, no focal deficits, moving all 4 extremities. Psychiatric: Normal mood and affect.   Urinalysis Dipstick trace ketones/microscopy negative   Assessment & Plan:    1. BPH with lower urinary tract symptoms PVR PVR 36 mL Trial tamsulosin  0.4 mg daily. Follow-up 1 month for symptom recheck and PSA  I have reviewed the above documentation for accuracy and completeness, and I agree with the above.   Glendia JAYSON Barba, MD  [facility] [address], [address] [place], [place] [address] [phone]

## 2023-11-23 ENCOUNTER — Encounter: Payer: Self-pay | Admitting: *Deleted

## 2023-12-07 ENCOUNTER — Other Ambulatory Visit: Payer: Self-pay | Admitting: *Deleted

## 2023-12-07 DIAGNOSIS — R972 Elevated prostate specific antigen [PSA]: Secondary | ICD-10-CM

## 2023-12-11 DIAGNOSIS — H25813 Combined forms of age-related cataract, bilateral: Secondary | ICD-10-CM | POA: Diagnosis not present

## 2023-12-19 ENCOUNTER — Other Ambulatory Visit: Payer: Medicare Other

## 2023-12-19 DIAGNOSIS — R972 Elevated prostate specific antigen [PSA]: Secondary | ICD-10-CM

## 2023-12-20 LAB — PSA: Prostate Specific Ag, Serum: 0.5 ng/mL (ref 0.0–4.0)

## 2023-12-31 ENCOUNTER — Ambulatory Visit (INDEPENDENT_AMBULATORY_CARE_PROVIDER_SITE_OTHER): Payer: Medicare Other | Admitting: Internal Medicine

## 2023-12-31 ENCOUNTER — Encounter: Payer: Self-pay | Admitting: Internal Medicine

## 2023-12-31 VITALS — BP 108/58 | HR 130 | Temp 100.4°F | Wt 167.0 lb

## 2023-12-31 DIAGNOSIS — R350 Frequency of micturition: Secondary | ICD-10-CM | POA: Diagnosis not present

## 2023-12-31 DIAGNOSIS — N401 Enlarged prostate with lower urinary tract symptoms: Secondary | ICD-10-CM

## 2023-12-31 DIAGNOSIS — R509 Fever, unspecified: Secondary | ICD-10-CM | POA: Diagnosis not present

## 2023-12-31 LAB — POC URINALSYSI DIPSTICK (AUTOMATED)
Blood, UA: NEGATIVE
Glucose, UA: NEGATIVE
Ketones, UA: NEGATIVE
Nitrite, UA: NEGATIVE
Protein, UA: POSITIVE — AB
Spec Grav, UA: 1.015 (ref 1.010–1.025)
Urobilinogen, UA: 4 U/dL — AB
pH, UA: 6 (ref 5.0–8.0)

## 2023-12-31 LAB — POC COVID19 BINAXNOW: SARS Coronavirus 2 Ag: NEGATIVE

## 2023-12-31 LAB — POCT INFLUENZA A/B
Influenza A, POC: NEGATIVE
Influenza B, POC: NEGATIVE

## 2023-12-31 MED ORDER — DOXYCYCLINE HYCLATE 100 MG PO TABS: 100.0000 mg | ORAL_TABLET | Freq: Two times a day (BID) | ORAL | 1 refills | Status: DC

## 2023-12-31 MED ORDER — TAMSULOSIN HCL 0.4 MG PO CAPS: 0.4000 mg | ORAL_CAPSULE | Freq: Every day | ORAL | 3 refills | Status: DC

## 2023-12-31 NOTE — Addendum Note (Signed)
Addended by: Eual Fines on: 12/31/2023 04:46 PM   Modules accepted: Orders

## 2023-12-31 NOTE — Assessment & Plan Note (Addendum)
May have retention now and is sick---will treat empirically as may have prostatitis again On the tamsulosin per Dr Lonna Cobb Will restart the doxycycline 100 bid x 10 days with refill

## 2023-12-31 NOTE — Progress Notes (Signed)
Subjective:    Patient ID: Travis Pacheco., male    DOB: 11-Aug-1951, 73 y.o.   MRN: [id number]  HPI Here due to acute illness  Started feeling sick yesterday AM Spent all day on the couch since then Fatigue is sever Not sure about fever Some chills/sweats--mild No aching Some cough--hacking up some phelgm. Some blood tinge ---the cough/sputum is not new Some SOB  Voiding every 15 minutes or so No pain No blood  No meds thus far  Current Outpatient Medications on File Prior to Visit  Medication Sig Dispense Refill   Multiple Vitamin (MULTIVITAMIN) capsule Take 1 capsule by mouth daily.     tamsulosin (FLOMAX) 0.4 MG CAPS capsule Take 1 capsule (0.4 mg total) by mouth daily. 30 capsule 2   triamcinolone cream (KENALOG) 0.1 % Apply 1 Application topically 2 (two) times daily as needed. 45 g 1   No current facility-administered medications on file prior to visit.    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    Family History  Problem Relation Age of Onset   Breast cancer Mother    Heart disease Father    Breast cancer Sister    Diabetes Neg Hx     Social History   Socioeconomic History   Marital status: Single    Spouse name: Not on file   Number of children: 2   Years of education: Not on file   Highest education level: Associate degree: occupational, Scientist, product/process development, or vocational program  Occupational History   Occupation: Holiday representative    Comment: own business  Tobacco Use   Smoking status: Former    Current packs/day: 0.00    Types: Cigarettes    Quit date: 1978    Years since quitting: 47.1    Passive exposure: Past (as a child)   Smokeless tobacco: Never  Substance and Sexual Activity   Alcohol use: Yes   Drug use: Not on file   Sexual activity: Not on file   Other Topics Concern   Not on file  Social History Narrative   1 son and 1 daughter   Social Drivers of Corporate investment banker Strain: High Risk (11/19/2023)   Overall Financial Resource Strain (CARDIA)    Difficulty of Paying Living Expenses: Very hard  Food Insecurity: No Food Insecurity (11/19/2023)   Hunger Vital Sign    Worried About Running Out of Food in the Last Year: Never true    Ran Out of Food in the Last Year: Never true  Transportation Needs: No Transportation Needs (11/19/2023)   PRAPARE - Administrator, Civil Service (Medical): No    Lack of Transportation (Non-Medical): No  Physical Activity: Insufficiently Active (11/19/2023)   Exercise Vital Sign    Days of Exercise per Week: 1 day    Minutes of Exercise per Session: 20 min  Stress: Stress Concern Present (11/19/2023)   [employer] of Occupational Health - Occupational Stress Questionnaire    Feeling of Stress : To some extent  Social Connections: Moderately Isolated (11/19/2023)   Social Connection and Isolation Panel [NHANES]    Frequency of Communication with Friends and Family: More than three times a week    Frequency of Social Gatherings with Friends and Family: More than three times  a week    Attends Religious Services: Never    Active Member of Clubs or Organizations: Yes    Attends Banker Meetings: 1 to 4 times per year    Marital Status: Divorced  Catering manager Violence: Not on file   Review of Systems Slight nausea--no vomiting Not eating Hasn't been taking the tamsulosin over the past few days     Objective:   Physical Exam Constitutional:      Comments: Appears mildly ill but no distress  HENT:     Head:     Comments: No sinus tenderness    Right Ear: Tympanic membrane and ear canal normal.     Left Ear: Tympanic membrane and ear canal normal.     Mouth/Throat:     Pharynx: No oropharyngeal exudate or posterior oropharyngeal erythema.  Pulmonary:      Effort: Pulmonary effort is normal.     Breath sounds: Normal breath sounds. No wheezing or rales.  Abdominal:     Palpations: Abdomen is soft.     Tenderness: There is no abdominal tenderness.  Musculoskeletal:     Cervical back: Neck supple.  Lymphadenopathy:     Cervical: No cervical adenopathy.            Assessment & Plan:

## 2023-12-31 NOTE — Assessment & Plan Note (Signed)
Sudden yesterday morning Viral symptoms but flu and COVID both negative Urinary frequency--so will check urinalysis Ongoing cough--now with some blood in sputum  Urinalysis shows 3+ leuks but negative nitrite Likely has some urinary retention and secondary infection--hasn't taken tamsulosin over the past few days (not sure where his Rx is)

## 2024-01-01 LAB — URINE CULTURE
MICRO NUMBER:: 16092685
SPECIMEN QUALITY:: ADEQUATE

## 2024-01-02 ENCOUNTER — Encounter: Payer: Self-pay | Admitting: Internal Medicine

## 2024-01-04 ENCOUNTER — Ambulatory Visit: Payer: Self-pay | Admitting: Urology

## 2024-01-11 ENCOUNTER — Other Ambulatory Visit: Payer: Self-pay | Admitting: Internal Medicine

## 2024-01-11 MED ORDER — ONDANSETRON HCL 4 MG PO TABS: 4.0000 mg | ORAL_TABLET | Freq: Three times a day (TID) | ORAL | 0 refills | Status: DC | PRN

## 2024-01-16 ENCOUNTER — Ambulatory Visit

## 2024-01-16 VITALS — BP 100/56 | Ht 76.0 in | Wt 168.8 lb

## 2024-01-16 DIAGNOSIS — Z0000 Encounter for general adult medical examination without abnormal findings: Secondary | ICD-10-CM | POA: Diagnosis not present

## 2024-01-16 NOTE — Patient Instructions (Signed)
 Travis Pacheco , Thank you for taking time to come for your Medicare Wellness Visit. I appreciate your ongoing commitment to your health goals. Please review the following plan we discussed and let me know if I can assist you in the future.   Referrals/Orders/Follow-Ups/Clinician Recommendations: none  This is a list of the screening recommended for you and due dates:  Health Maintenance  Topic Date Due   Hepatitis C Screening  Never done   DTaP/Tdap/Td vaccine (1 - Tdap) Never done   Colon Cancer Screening  Never done   Zoster (Shingles) Vaccine (1 of 2) Never done   Pneumonia Vaccine (2 of 2 - PPSV23 or PCV20) 12/03/2017   Flu Shot  02/11/2024*   Medicare Annual Wellness Visit  01/15/2025   HPV Vaccine  Aged Out   COVID-19 Vaccine  Discontinued  *Topic was postponed. The date shown is not the original due date.    Advanced directives: (Declined) Advance directive discussed with you today. Even though you declined this today, please call our office should you change your mind, and we can give you the proper paperwork for you to fill out.  Next Medicare Annual Wellness Visit scheduled for next year: Yes 01/16/2025 @ 11:30am in person

## 2024-01-16 NOTE — Progress Notes (Signed)
 Subjective:   Travis Pacheco. is a 73 y.o. who presents for a Medicare Wellness preventive visit.  Visit Complete: In person  AWV Questionnaire: No: Patient Medicare AWV questionnaire was not completed prior to this visit.  Cardiac Risk Factors include: advanced age (>13men, >9 women);male gender;none     Objective:    Today's Vitals   01/16/24 1122  BP: (!) 100/56  Weight: 168 lb 12.8 oz (76.6 kg)  Height: 6\' 4"  (1.93 m)   Body mass index is 20.55 kg/m.     01/16/2024   12:00 PM  Advanced Directives  Does Patient Have a Medical Advance Directive? No    Current Medications (verified) Outpatient Encounter Medications as of 01/16/2024  Medication Sig   Multiple Vitamin (MULTIVITAMIN) capsule Take 1 capsule by mouth daily.   tamsulosin (FLOMAX) 0.4 MG CAPS capsule Take 1 capsule (0.4 mg total) by mouth daily.   triamcinolone cream (KENALOG) 0.1 % Apply 1 Application topically 2 (two) times daily as needed.   doxycycline (VIBRA-TABS) 100 MG tablet Take 1 tablet (100 mg total) by mouth 2 (two) times daily. (Patient not taking: Reported on 01/16/2024)   ondansetron (ZOFRAN) 4 MG tablet Take 1 tablet (4 mg total) by mouth 3 (three) times daily as needed for nausea or vomiting. (Patient not taking: Reported on 01/16/2024)   No facility-administered encounter medications on file as of 01/16/2024.    Allergies (verified) Patient has no known allergies.   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   Family History  Problem Relation Age of Onset   Breast cancer Mother    Heart disease Father    Breast cancer Sister    Diabetes Neg Hx    Social History   Socioeconomic History   Marital status: Single    Spouse name: Not on file   Number of children: 2   Years of education: Not on file    Highest education level: Associate degree: occupational, Scientist, product/process development, or vocational program  Occupational History   Occupation: Holiday representative    Comment: own business  Tobacco Use   Smoking status: Former    Current packs/day: 0.00    Types: Cigarettes    Quit date: 1978    Years since quitting: 47.2    Passive exposure: Past (as a child)   Smokeless tobacco: Never  Substance and Sexual Activity   Alcohol use: Yes   Drug use: Not on file   Sexual activity: Not on file  Other Topics Concern   Not on file  Social History Narrative   1 son and 1 daughter   Social Drivers of Corporate investment banker Strain: Low Risk  (01/16/2024)   Overall Financial Resource Strain (CARDIA)    Difficulty of Paying Living Expenses: Not hard at all  Recent Concern: Financial Resource Strain - High Risk (11/19/2023)   Overall Financial Resource Strain (CARDIA)    Difficulty of Paying Living Expenses: Very hard  Food Insecurity: No Food Insecurity (01/16/2024)   Hunger Vital Sign    Worried About Running Out of Food in the Last Year: Never true    Ran Out of Food in the Last Year: Never true  Transportation Needs: No Transportation Needs (01/16/2024)   PRAPARE - Administrator, Civil Service (Medical): No  Lack of Transportation (Non-Medical): No  Physical Activity: Insufficiently Active (01/16/2024)   Exercise Vital Sign    Days of Exercise per Week: 3 days    Minutes of Exercise per Session: 30 min  Stress: No Stress Concern Present (01/16/2024)   [employer] of Occupational Health - Occupational Stress Questionnaire    Feeling of Stress : Not at all  Recent Concern: Stress - Stress Concern Present (11/19/2023)   [employer] of Occupational Health - Occupational Stress Questionnaire    Feeling of Stress : To some extent  Social Connections: Moderately Isolated (01/16/2024)   Social Connection and Isolation Panel [NHANES]    Frequency of Communication with Friends and Family: More  than three times a week    Frequency of Social Gatherings with Friends and Family: More than three times a week    Attends Religious Services: More than 4 times per year    Active Member of [employer] or Organizations: No    Attends Engineer, structural: Never    Marital Status: Divorced    Tobacco Counseling Counseling given: Not Answered  Clinical Intake:  Pre-visit preparation completed: Yes  Pain : No/denies pain    BMI - recorded: 20.55 Nutritional Status: BMI of 19-24  Normal Nutritional Risks: None Diabetes: No  How often do you need to have someone help you when you read instructions, pamphlets, or other written materials from your doctor or pharmacy?: 1 - Never  Interpreter Needed?: No  Comments: [facility] pt Information entered by :: B.Gillie Fleites,LPN   Activities of Daily Living     01/16/2024   11:47 AM  In your present state of health, do you have any difficulty performing the following activities:  Hearing? 1  Vision? 0  Difficulty concentrating or making decisions? 0  Walking or climbing stairs? 0  Dressing or bathing? 0  Doing errands, shopping? 0  Preparing Food and eating ? N  Using the Toilet? N  In the past six months, have you accidently leaked urine? N  Do you have problems with loss of bowel control? N  Managing your Medications? N  Managing your Finances? N  Housekeeping or managing your Housekeeping? N    Patient Care Team: [name], MD as PCP - General (Internal Medicine)  Indicate any recent Medical Services you may have received from other than [facility] providers in the past year (date may be approximate).     Assessment:   This is a routine wellness examination for Travis Pacheco.  Hearing/Vision screen Hearing Screening - Comments:: Pt says his hearing is terrible Audiology referral Vision Screening - Comments:: Pt says vision is good w/glasses [facility]/Dr [facility]   Goals Addressed             This Visit's Progress     Patient Stated       I would like to stay healthy and keep working       Depression Screen     01/16/2024   11:35 AM 03/15/2023    9:42 AM 10/08/2017    2:31 PM  PHQ 2/9 Scores  PHQ - 2 Score 0 3 0  PHQ- 9 Score  10     Fall Risk     01/16/2024   11:29 AM 03/29/2023    9:13 AM 10/08/2017    2:31 PM  Fall Risk   Falls in the past year? 0 0 No  Number falls in past yr: 0 0   Injury with Fall? 0 0   Risk  for fall due to : No Fall Risks No Fall Risks   Follow up Education provided;Falls prevention discussed Follow up appointment     MEDICARE RISK AT HOME:  Medicare Risk at Home Any stairs in or around the home?: No If so, are there any without handrails?: No Home free of loose throw rugs in walkways, pet beds, electrical cords, etc?: Yes Adequate lighting in your home to reduce risk of falls?: Yes Life alert?: No Use of a cane, walker or w/c?: No Grab bars in the bathroom?: No Shower chair or bench in shower?: No Elevated toilet seat or a handicapped toilet?: No  TIMED UP AND GO:  Was the test performed?  Yes  Length of time to ambulate 10 feet: 8 sec Gait steady and fast without use of assistive device  Cognitive Function: 6CIT completed        01/16/2024   11:52 AM  6CIT Screen  What Year? 0 points  What month? 0 points  What time? 0 points  Count back from 20 0 points  Months in reverse 0 points  Repeat phrase 0 points  Total Score 0 points    Immunizations Immunization History  Administered Date(s) Administered   Influenza,inj,Quad PF,6+ Mos 10/08/2017, 12/07/2019, 09/06/2020   Moderna Sars-Covid-2 Vaccination 01/05/2020, 02/03/2020   PFIZER(Purple Top)SARS-COV-2 Vaccination 10/11/2020   Pneumococcal Conjugate-13 10/08/2017    Screening Tests Health Maintenance  Topic Date Due   Hepatitis C Screening  Never done   DTaP/Tdap/Td (1 - Tdap) Never done   Colonoscopy  Never done   Zoster Vaccines- Shingrix (1 of 2) Never done   Pneumonia Vaccine  42+ Years old (2 of 2 - PPSV23 or PCV20) 12/03/2017   INFLUENZA VACCINE  02/11/2024 (Originally 06/14/2023)   Medicare Annual Wellness (AWV)  01/15/2025   HPV VACCINES  Aged Out   COVID-19 Vaccine  Discontinued    Health Maintenance  Health Maintenance Due  Topic Date Due   Hepatitis C Screening  Never done   DTaP/Tdap/Td (1 - Tdap) Never done   Colonoscopy  Never done   Zoster Vaccines- Shingrix (1 of 2) Never done   Pneumonia Vaccine 46+ Years old (2 of 2 - PPSV23 or PCV20) 12/03/2017   Health Maintenance Items Addressed:  Additional Screening:  Vision Screening: Recommended annual ophthalmology exams for early detection of glaucoma and other disorders of the eye.  Dental Screening: Recommended annual dental exams for proper oral hygiene  Community Resource Referral / Chronic Care Management: CRR required this visit?  No   CCM required this visit?  No    Plan:     I have personally reviewed and noted the following in the patient's chart:   Medical and social history Use of alcohol, tobacco or illicit drugs  Current medications and supplements including opioid prescriptions. Patient is not currently taking opioid prescriptions. Functional ability and status Nutritional status Physical activity Advanced directives List of other physicians Hospitalizations, surgeries, and ER visits in previous 12 months Vitals Screenings to include cognitive, depression, and falls Referrals and appointments  In addition, I have reviewed and discussed with patient certain preventive protocols, quality metrics, and best practice recommendations. A written personalized care plan for preventive services as well as general preventive health recommendations were provided to patient.    Sue Lush, LPN   0/07/8118   After Visit Summary: (MyChart) Due to this being a telephonic visit, the after visit summary with patients personalized plan was offered to patient via MyChart  Notes:  pt blood pressure a little low from his norm. Pt relays he had a "stomach bug" over the weekend, having n/v. Pt encouraged to hydrate well with water over the next several day (especially today). He indicates he will.

## 2024-02-18 DIAGNOSIS — D692 Other nonthrombocytopenic purpura: Secondary | ICD-10-CM | POA: Diagnosis not present

## 2024-02-18 DIAGNOSIS — D485 Neoplasm of uncertain behavior of skin: Secondary | ICD-10-CM | POA: Diagnosis not present

## 2024-02-18 DIAGNOSIS — L570 Actinic keratosis: Secondary | ICD-10-CM | POA: Diagnosis not present

## 2024-02-18 DIAGNOSIS — L400 Psoriasis vulgaris: Secondary | ICD-10-CM | POA: Diagnosis not present

## 2024-03-13 DIAGNOSIS — L570 Actinic keratosis: Secondary | ICD-10-CM | POA: Diagnosis not present

## 2024-05-19 ENCOUNTER — Ambulatory Visit (INDEPENDENT_AMBULATORY_CARE_PROVIDER_SITE_OTHER): Payer: Medicare Other | Admitting: Internal Medicine

## 2024-05-19 ENCOUNTER — Ambulatory Visit: Payer: Self-pay | Admitting: Internal Medicine

## 2024-05-19 ENCOUNTER — Encounter: Payer: Self-pay | Admitting: Internal Medicine

## 2024-05-19 VITALS — BP 112/50 | HR 62 | Temp 98.1°F | Ht 74.0 in | Wt 172.0 lb

## 2024-05-19 DIAGNOSIS — Z1211 Encounter for screening for malignant neoplasm of colon: Secondary | ICD-10-CM

## 2024-05-19 DIAGNOSIS — Z23 Encounter for immunization: Secondary | ICD-10-CM

## 2024-05-19 DIAGNOSIS — K7030 Alcoholic cirrhosis of liver without ascites: Secondary | ICD-10-CM | POA: Diagnosis not present

## 2024-05-19 DIAGNOSIS — F1020 Alcohol dependence, uncomplicated: Secondary | ICD-10-CM | POA: Diagnosis not present

## 2024-05-19 DIAGNOSIS — N401 Enlarged prostate with lower urinary tract symptoms: Secondary | ICD-10-CM

## 2024-05-19 DIAGNOSIS — Z0000 Encounter for general adult medical examination without abnormal findings: Secondary | ICD-10-CM | POA: Diagnosis not present

## 2024-05-19 LAB — HEPATIC FUNCTION PANEL
ALT: 39 U/L (ref 0–53)
AST: 56 U/L — ABNORMAL HIGH (ref 0–37)
Albumin: 4 g/dL (ref 3.5–5.2)
Alkaline Phosphatase: 56 U/L (ref 39–117)
Bilirubin, Direct: 0.3 mg/dL (ref 0.0–0.3)
Total Bilirubin: 1 mg/dL (ref 0.2–1.2)
Total Protein: 7 g/dL (ref 6.0–8.3)

## 2024-05-19 LAB — CBC
HCT: 40.7 % (ref 39.0–52.0)
Hemoglobin: 13.8 g/dL (ref 13.0–17.0)
MCHC: 33.8 g/dL (ref 30.0–36.0)
MCV: 95.1 fl (ref 78.0–100.0)
Platelets: 103 K/uL — ABNORMAL LOW (ref 150.0–400.0)
RBC: 4.28 Mil/uL (ref 4.22–5.81)
RDW: 14.1 % (ref 11.5–15.5)
WBC: 5.3 K/uL (ref 4.0–10.5)

## 2024-05-19 LAB — RENAL FUNCTION PANEL
Albumin: 4 g/dL (ref 3.5–5.2)
BUN: 16 mg/dL (ref 6–23)
CO2: 32 meq/L (ref 19–32)
Calcium: 9.1 mg/dL (ref 8.4–10.5)
Chloride: 102 meq/L (ref 96–112)
Creatinine, Ser: 0.87 mg/dL (ref 0.40–1.50)
GFR: 86.66 mL/min (ref >60.00)
Glucose, Bld: 83 mg/dL (ref 70–99)
Phosphorus: 3.5 mg/dL (ref 2.3–4.6)
Potassium: 3.8 meq/L (ref 3.5–5.1)
Sodium: 139 meq/L (ref 135–145)

## 2024-05-19 MED ORDER — ONDANSETRON HCL 4 MG PO TABS: 4.0000 mg | ORAL_TABLET | Freq: Three times a day (TID) | ORAL | 0 refills | Status: AC | PRN

## 2024-05-19 MED ORDER — TRIAMCINOLONE ACETONIDE 0.1 % EX CREA: 1.0000 | TOPICAL_CREAM | Freq: Two times a day (BID) | CUTANEOUS | 1 refills | Status: AC | PRN

## 2024-05-19 NOTE — Progress Notes (Signed)
 Subjective:    Patient ID: Travis ELINORE Massie Mickey., male    DOB: November 26, 1950, 73 y.o.   MRN: [id number]  HPI Here for physical  4 days ago--started some pain in upper legs/lower abdomen Felt muscular---fell after sitting in a camp chair that collapsed --and needed help getting up Felt bad --could have been something he ate  Easy bruising still Has restarted drinking some---mostly beer (up to 4 per day) Discussed limiting this  Tamsulosin  is helping him void  Business is slowing down --his choice  Current Outpatient Medications on File Prior to Visit  Medication Sig Dispense Refill   Multiple Vitamin (MULTIVITAMIN) capsule Take 1 capsule by mouth daily.     tamsulosin  (FLOMAX ) 0.4 MG CAPS capsule Take 1 capsule (0.4 mg total) by mouth daily. 90 capsule 3   No current facility-administered medications on file prior to visit.    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    Family History  Problem Relation Age of Onset   Breast cancer Mother    Heart disease Father    Breast cancer Sister    Diabetes Neg Hx     Social History   Socioeconomic History   Marital status: Single    Spouse name: Not on file   Number of children: 2   Years of education: Not on file   Highest education level: Associate degree: occupational, Scientist, product/process development, or vocational program  Occupational History   Occupation: Holiday representative    Comment: own business  Tobacco Use   Smoking status: Former    Current packs/day: 0.00    Types: Cigarettes    Quit date: 1978    Years since quitting: 47.5    Passive exposure: Past (as a child)   Smokeless tobacco: Never  Substance and Sexual Activity   Alcohol use: Yes   Drug use: Not on file   Sexual activity: Not on file  Other Topics Concern   Not on file  Social  History Narrative   1 son and 1 daughter   Social Drivers of Corporate investment banker Strain: Low Risk  (01/16/2024)   Overall Financial Resource Strain (CARDIA)    Difficulty of Paying Living Expenses: Not hard at all  Recent Concern: Financial Resource Strain - High Risk (11/19/2023)   Overall Financial Resource Strain (CARDIA)    Difficulty of Paying Living Expenses: Very hard  Food Insecurity: No Food Insecurity (01/16/2024)   Hunger Vital Sign    Worried About Running Out of Food in the Last Year: Never true    Ran Out of Food in the Last Year: Never true  Transportation Needs: No Transportation Needs (01/16/2024)   PRAPARE - Administrator, Civil Service (Medical): No    Lack of Transportation (Non-Medical): No  Physical Activity: Insufficiently Active (01/16/2024)   Exercise Vital Sign    Days of Exercise per Week: 3 days    Minutes of Exercise per Session: 30 min  Stress: No Stress Concern Present (01/16/2024)   [employer] of Occupational Health - Occupational Stress Questionnaire    Feeling of Stress : Not at all  Recent Concern: Stress - Stress Concern Present (11/19/2023)   [employer] of Occupational Health - Occupational Stress Questionnaire    Feeling of Stress : To some  extent  Social Connections: Moderately Isolated (01/16/2024)   Social Connection and Isolation Panel    Frequency of Communication with Friends and Family: More than three times a week    Frequency of Social Gatherings with Friends and Family: More than three times a week    Attends Religious Services: More than 4 times per year    Active Member of [employer] or Organizations: No    Attends Banker Meetings: Never    Marital Status: Divorced  Catering manager Violence: Not At Risk (01/16/2024)   Humiliation, Afraid, Rape, and Kick questionnaire    Fear of Current or Ex-Partner: No    Emotionally Abused: No    Physically Abused: No    Sexually Abused: No   Review of Systems   Constitutional:  Negative for fatigue and unexpected weight change.       Wears seat belt  HENT:  Positive for hearing loss. Negative for tinnitus.        Needs audiology evaluation Needs more dental work  Eyes:  Negative for visual disturbance.       No diplopia or unilateral vision loss  Respiratory:  Negative for cough, chest tightness and shortness of breath.   Cardiovascular:  Negative for chest pain and palpitations.       Slight leg swelling and tingling---lidocaine helps  Gastrointestinal:  Negative for blood in stool and constipation.       No heartburn--or rare. No longer needs omeprazole   Endocrine: Negative for polydipsia and polyuria.  Genitourinary:  Negative for difficulty urinating and urgency.       No sexual concerns  Musculoskeletal:  Positive for neck pain. Negative for arthralgias, back pain and joint swelling.       Neck pain--relates to sleeping on couch  Skin:  Negative for rash.       Did have biopsy and cryotherapy by derm  Allergic/Immunologic: Positive for environmental allergies. Negative for immunocompromised state.       Rarely uses OTC  Neurological:  Negative for dizziness, syncope, light-headedness and headaches.  Hematological:  Negative for adenopathy. Bruises/bleeds easily.  Psychiatric/Behavioral:  Negative for dysphoric mood and sleep disturbance. The patient is not nervous/anxious.        Objective:   Physical Exam Constitutional:      Appearance: Normal appearance.  HENT:     Mouth/Throat:     Pharynx: No oropharyngeal exudate or posterior oropharyngeal erythema.  Eyes:     Conjunctiva/sclera: Conjunctivae normal.     Pupils: Pupils are equal, round, and reactive to light.  Cardiovascular:     Rate and Rhythm: Normal rate and regular rhythm.     Pulses: Normal pulses.     Heart sounds: No murmur heard.    No gallop.  Pulmonary:     Effort: Pulmonary effort is normal.     Breath sounds: Normal breath sounds. No wheezing or rales.   Abdominal:     Palpations: Abdomen is soft.     Tenderness: There is no abdominal tenderness.     Comments: Liver percussed ~3 cm below RCM  Musculoskeletal:     Cervical back: Neck supple.     Right lower leg: No edema.     Left lower leg: No edema.  Lymphadenopathy:     Cervical: No cervical adenopathy.  Skin:    Findings: No lesion or rash.  Neurological:     General: No focal deficit present.     Mental Status: He is alert and oriented to person,  place, and time.  Psychiatric:        Mood and Affect: Mood normal.        Behavior: Behavior normal.            Assessment & Plan:

## 2024-05-19 NOTE — Assessment & Plan Note (Signed)
 Will do FIT Recent PSA normal Prevnar 20 today Shingrix at pharmacy Flu/COVID vaccines in the fall Needs to exercise when slowing down with work

## 2024-05-19 NOTE — Assessment & Plan Note (Signed)
 Back to drinking beer regularly Counseled about my concerns--and need to stop if possible

## 2024-05-19 NOTE — Assessment & Plan Note (Signed)
Better with the tamsulosin 

## 2024-05-19 NOTE — Addendum Note (Signed)
 Addended by: JIMMY ADE I on: 05/19/2024 12:11 PM   Modules accepted: Orders

## 2024-05-19 NOTE — Assessment & Plan Note (Signed)
 Easy bruising---otherwise no symptoms Will check ultrasound

## 2024-05-19 NOTE — Addendum Note (Signed)
 Addended by: TENNIE RAISIN B on: 05/19/2024 12:34 PM   Modules accepted: Orders

## 2024-06-04 ENCOUNTER — Ambulatory Visit
Admission: RE | Admit: 2024-06-04 | Discharge: 2024-06-04 | Disposition: A | Discharge status: Home / Self Care | Source: Ambulatory Visit | Attending: Internal Medicine | Admitting: Internal Medicine

## 2024-06-04 DIAGNOSIS — K7460 Unspecified cirrhosis of liver: Secondary | ICD-10-CM | POA: Diagnosis not present

## 2024-06-04 DIAGNOSIS — K7030 Alcoholic cirrhosis of liver without ascites: Secondary | ICD-10-CM | POA: Insufficient documentation

## 2024-07-06 DIAGNOSIS — J209 Acute bronchitis, unspecified: Secondary | ICD-10-CM | POA: Diagnosis not present

## 2024-08-05 ENCOUNTER — Ambulatory Visit (INDEPENDENT_AMBULATORY_CARE_PROVIDER_SITE_OTHER): Admitting: Family Medicine

## 2024-08-05 ENCOUNTER — Ambulatory Visit: Payer: Self-pay | Admitting: Family Medicine

## 2024-08-05 ENCOUNTER — Encounter: Payer: Self-pay | Admitting: Family Medicine

## 2024-08-05 VITALS — BP 90/60 | HR 89 | Temp 98.5°F | Ht 74.0 in | Wt 172.5 lb

## 2024-08-05 DIAGNOSIS — R0789 Other chest pain: Secondary | ICD-10-CM | POA: Diagnosis not present

## 2024-08-05 DIAGNOSIS — R5383 Other fatigue: Secondary | ICD-10-CM | POA: Insufficient documentation

## 2024-08-05 LAB — CBC WITH DIFFERENTIAL/PLATELET
Basophils Absolute: 0 K/uL (ref 0.0–0.1)
Basophils Relative: 0.8 % (ref 0.0–3.0)
Eosinophils Absolute: 0.2 K/uL (ref 0.0–0.7)
Eosinophils Relative: 4.2 % (ref 0.0–5.0)
HCT: 42.3 % (ref 39.0–52.0)
Hemoglobin: 14.3 g/dL (ref 13.0–17.0)
Lymphocytes Relative: 38.4 % (ref 12.0–46.0)
Lymphs Abs: 2.2 K/uL (ref 0.7–4.0)
MCHC: 33.8 g/dL (ref 30.0–36.0)
MCV: 97.2 fl (ref 78.0–100.0)
Monocytes Absolute: 0.5 K/uL (ref 0.1–1.0)
Monocytes Relative: 8.5 % (ref 3.0–12.0)
Neutro Abs: 2.7 K/uL (ref 1.4–7.7)
Neutrophils Relative %: 48.1 % (ref 43.0–77.0)
Platelets: 113 K/uL — ABNORMAL LOW (ref 150.0–400.0)
RBC: 4.36 Mil/uL (ref 4.22–5.81)
RDW: 12.7 % (ref 11.5–15.5)
WBC: 5.6 K/uL (ref 4.0–10.5)

## 2024-08-05 LAB — COMPREHENSIVE METABOLIC PANEL WITH GFR
ALT: 29 U/L (ref 0–53)
AST: 45 U/L — ABNORMAL HIGH (ref 0–37)
Albumin: 4.4 g/dL (ref 3.5–5.2)
Alkaline Phosphatase: 54 U/L (ref 39–117)
BUN: 17 mg/dL (ref 6–23)
CO2: 31 meq/L (ref 19–32)
Calcium: 9.8 mg/dL (ref 8.4–10.5)
Chloride: 98 meq/L (ref 96–112)
Creatinine, Ser: 0.93 mg/dL (ref 0.40–1.50)
GFR: 82.35 mL/min (ref >60.00)
Glucose, Bld: 100 mg/dL — ABNORMAL HIGH (ref 70–99)
Potassium: 4.3 meq/L (ref 3.5–5.1)
Sodium: 137 meq/L (ref 135–145)
Total Bilirubin: 0.8 mg/dL (ref 0.2–1.2)
Total Protein: 7.6 g/dL (ref 6.0–8.3)

## 2024-08-05 LAB — TSH: TSH: 1.86 u[IU]/mL (ref 0.35–5.50)

## 2024-08-05 LAB — VITAMIN B12: Vitamin B-12: 589 pg/mL (ref 211–911)

## 2024-08-05 LAB — T3, FREE: T3, Free: 3.4 pg/mL (ref 2.3–4.2)

## 2024-08-05 LAB — T4, FREE: Free T4: 0.82 ng/dL (ref 0.60–1.60)

## 2024-08-05 LAB — VITAMIN D 25 HYDROXY (VIT D DEFICIENCY, FRACTURES): VITD: 25.12 ng/mL — ABNORMAL LOW (ref 30.00–100.00)

## 2024-08-05 MED ORDER — TAMSULOSIN HCL 0.4 MG PO CAPS: 0.4000 mg | ORAL_CAPSULE | Freq: Every day | ORAL | 0 refills | Status: AC

## 2024-08-05 NOTE — Progress Notes (Signed)
 Patient ID: Travis Pacheco., male    DOB: Oct 06, 1951, 73 y.o.   MRN: [id number]  This visit was conducted in person.  BP 90/60   Pulse 89   Temp 98.5 F (36.9 C) (Temporal)   Ht 6' 2 (1.88 m)   Wt 172 lb 8 oz (78.2 kg)   SpO2 96%   BMI 22.15 kg/m    CC:  Chief Complaint  Patient presents with   Fatigue    Requesting a Stress Test    Subjective:   HPI: Travis Pacheco. is a 73 y.o. male patient of Dr. Jimmy with history of alcohol use disorder, moderate, dependence with associated alcoholic cirrhosis of the liver presenting on 08/05/2024 for Fatigue (Requesting a Stress Test)   Labs from May 19, 2024 reviewed CBC, renal function panel and hepatic panel essentially normal ( only slight elevation of AST)   He has been feeling more fatigued in last 6 weeks.   6 weeks ago had severe cold.. 2 weeks later  went to urgent treated with antibiotics ( amoxicillin ).  Felt better. Following this illness he had persistent fatigue. Decreased endurance. Not able to keep up at work like he used to. Has been feeling more lightheaded lately.  No SOB with exertion.  Felt chest pressure couple days ago lasted all day.. sore to touch in left upper chest. No exertional symptoms. Back to normal eating habits, but does not eat much . Minimal water intake.  Wt Readings from Last 3 Encounters:  08/05/24 172 lb 8 oz (78.2 kg)  05/19/24 172 lb (78 kg)  01/16/24 168 lb 12.8 oz (76.6 kg)   BP Readings from Last 3 Encounters:  08/05/24 90/60  05/19/24 (!) 112/50  01/16/24 (!) 100/56      Hit wall and fell down last night when sleepy going to the bathroom     Relevant past medical, surgical, family and social history reviewed and updated as indicated. Interim medical history since our last visit reviewed. Allergies and medications reviewed and updated. Outpatient Medications Prior to Visit  Medication Sig Dispense Refill   Multiple Vitamin (MULTIVITAMIN) capsule Take 1 capsule by  mouth daily.     ondansetron  (ZOFRAN ) 4 MG tablet Take 1 tablet (4 mg total) by mouth 3 (three) times daily as needed for nausea or vomiting. 20 tablet 0   triamcinolone  cream (KENALOG ) 0.1 % Apply 1 Application topically 2 (two) times daily as needed. 45 g 1   tamsulosin  (FLOMAX ) 0.4 MG CAPS capsule Take 1 capsule (0.4 mg total) by mouth daily. 90 capsule 3   No facility-administered medications prior to visit.     Per HPI unless specifically indicated in ROS section below Review of Systems  Constitutional:  Negative for fatigue and fever.  HENT:  Negative for ear pain.   Eyes:  Negative for pain.  Respiratory:  Negative for cough and shortness of breath.   Cardiovascular:  Positive for chest pain. Negative for palpitations and leg swelling.  Gastrointestinal:  Negative for abdominal pain.  Genitourinary:  Negative for dysuria.  Musculoskeletal:  Negative for arthralgias.  Neurological:  Negative for syncope, light-headedness and headaches.  Psychiatric/Behavioral:  Negative for dysphoric mood.    Objective:  BP 90/60   Pulse 89   Temp 98.5 F (36.9 C) (Temporal)   Ht 6' 2 (1.88 m)   Wt 172 lb 8 oz (78.2 kg)   SpO2 96%   BMI 22.15 kg/m   Wt Readings from Last  3 Encounters:  08/05/24 172 lb 8 oz (78.2 kg)  05/19/24 172 lb (78 kg)  01/16/24 168 lb 12.8 oz (76.6 kg)      Physical Exam Vitals reviewed.  Constitutional:      Appearance: He is well-developed.  HENT:     Head: Normocephalic.     Right Ear: Hearing normal.     Left Ear: Hearing normal.     Nose: Nose normal.  Neck:     Thyroid : No thyroid  mass or thyromegaly.     Vascular: No carotid bruit.     Trachea: Trachea normal.  Cardiovascular:     Rate and Rhythm: Normal rate and regular rhythm.     Pulses: Normal pulses.     Heart sounds: Heart sounds not distant. No murmur heard.    No friction rub. No gallop.     Comments: No peripheral edema Pulmonary:     Effort: Pulmonary effort is normal. No  respiratory distress.     Breath sounds: Normal breath sounds.  Chest:     Chest wall: Tenderness present. No edema.       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 Skin:    General: Skin is warm and dry.     Findings: No rash.  Psychiatric:        Speech: Speech normal.        Behavior: Behavior normal.        Thought Content: Thought content normal.       Results for orders placed or performed in visit on 08/05/24  CBC with Differential/Platelet   Collection Time: 08/05/24 12:21 PM  Result Value Ref Range   WBC 5.6 4.0 - 10.5 K/uL   RBC 4.36 4.22 - 5.81 Mil/uL   Hemoglobin 14.3 13.0 - 17.0 g/dL   HCT 57.6 60.9 - 47.9 %   MCV 97.2 78.0 - 100.0 fl   MCHC 33.8 30.0 - 36.0 g/dL   RDW 87.2 88.4 - 84.4 %   Platelets 113.0 (L) 150.0 - 400.0 K/uL   Neutrophils Relative % 48.1 43.0 - 77.0 %   Lymphocytes Relative 38.4 12.0 - 46.0 %   Monocytes Relative 8.5 3.0 - 12.0 %   Eosinophils Relative 4.2 0.0 - 5.0 %   Basophils Relative 0.8 0.0 - 3.0 %   Neutro Abs 2.7 1.4 - 7.7 K/uL   Lymphs Abs 2.2 0.7 - 4.0 K/uL   Monocytes Absolute 0.5 0.1 - 1.0 K/uL   Eosinophils Absolute 0.2 0.0 - 0.7 K/uL   Basophils Absolute 0.0 0.0 - 0.1 K/uL  Comprehensive metabolic panel with GFR   Collection Time: 08/05/24 12:21 PM  Result Value Ref Range   Sodium 137 135 - 145 mEq/L   Potassium 4.3 3.5 - 5.1 mEq/L   Chloride 98 96 - 112 mEq/L   CO2 31 19 - 32 mEq/L   Glucose, Bld 100 (H) 70 - 99 mg/dL   BUN 17 6 - 23 mg/dL   Creatinine, Ser 9.06 0.40 - 1.50 mg/dL   Total Bilirubin 0.8 0.2 - 1.2 mg/dL   Alkaline Phosphatase 54 39 - 117 U/L   AST 45 (H) 0 - 37 U/L   ALT 29 0 - 53 U/L   Total Protein 7.6 6.0 - 8.3 g/dL   Albumin 4.4 3.5 - 5.2 g/dL   GFR 17.64 >39.99 mL/min   Calcium 9.8 8.4 - 10.5 mg/dL  VITAMIN D  25 Hydroxy (Vit-D Deficiency, Fractures)  Collection Time: 08/05/24 12:21 PM  Result Value Ref Range   VITD 25.12 (L) 30.00 - 100.00  ng/mL  Vitamin B12   Collection Time: 08/05/24 12:21 PM  Result Value Ref Range   Vitamin B-12 589 211 - 911 pg/mL  TSH   Collection Time: 08/05/24 12:21 PM  Result Value Ref Range   TSH 1.86 0.35 - 5.50 uIU/mL  T4, free   Collection Time: 08/05/24 12:21 PM  Result Value Ref Range   Free T4 0.82 0.60 - 1.60 ng/dL  T3, free   Collection Time: 08/05/24 12:21 PM  Result Value Ref Range   T3, Free 3.4 2.3 - 4.2 pg/mL    Assessment and Plan  Atypical chest pain Assessment & Plan: Acute, atypical chest pain, nonexertional, unlikely to be cardiac but patient is somewhat higher risk given age. Chest pain most consistent with chest wall tenderness EKG: Normal sinus rhythm, no ST changes, no Q waves, no LVH Offered referral for evaluation but we decided to evaluate fatigue first with lab work.  If severe chest pain associated with shortness of breath, patient will go to the emergency room. Return and ER precautions provided.  Orders: -     EKG 12-Lead  Other fatigue Assessment & Plan: Acute, ongoing since recent respiratory illness.  Will evaluate with complete metabolic panel, thyroid  function tests, vitamin evaluation and check for anemia. Patient reports he sleeps well at night for the most part.  Unsure if he snores.   Orders: -     CBC with Differential/Platelet -     Comprehensive metabolic panel with GFR -     VITAMIN D  25 Hydroxy (Vit-D Deficiency, Fractures) -     Vitamin B12 -     TSH -     T4, free -     T3, free  Other orders -     Tamsulosin  HCl; Take 1 capsule (0.4 mg total) by mouth daily.  Dispense: 90 capsule; Refill: 0    No follow-ups on file.   Greig Ring, MD

## 2024-08-05 NOTE — Assessment & Plan Note (Signed)
 Acute, ongoing since recent respiratory illness.  Will evaluate with complete metabolic panel, thyroid  function tests, vitamin evaluation and check for anemia. Patient reports he sleeps well at night for the most part.  Unsure if he snores.

## 2024-08-05 NOTE — Assessment & Plan Note (Signed)
 Acute, atypical chest pain, nonexertional, unlikely to be cardiac but patient is somewhat higher risk given age. Chest pain most consistent with chest wall tenderness EKG: Normal sinus rhythm, no ST changes, no Q waves, no LVH Offered referral for evaluation but we decided to evaluate fatigue first with lab work.  If severe chest pain associated with shortness of breath, patient will go to the emergency room. Return and ER precautions provided.

## 2024-08-11 MED ORDER — VITAMIN D3 1.25 MG (50000 UT) PO CAPS: 1.0000 | ORAL_CAPSULE | ORAL | 0 refills | Status: AC

## 2024-08-11 NOTE — Addendum Note (Signed)
 Addended by: WENDELL ARLAND RAMAN on: 08/11/2024 04:42 PM   Modules accepted: Orders

## 2025-01-16 ENCOUNTER — Ambulatory Visit

## 2025-05-20 ENCOUNTER — Encounter
